# Patient Record
Sex: Female | Born: 1980 | Race: Black or African American | Hispanic: No | Marital: Married | Smoking: Never smoker
Health system: Southern US, Community
[De-identification: ages and names within clinical notes are randomized; demographics above are authoritative.]

## PROBLEM LIST (undated history)

## (undated) DIAGNOSIS — F419 Anxiety disorder, unspecified: Secondary | ICD-10-CM

## (undated) DIAGNOSIS — O039 Complete or unspecified spontaneous abortion without complication: Secondary | ICD-10-CM

## (undated) DIAGNOSIS — A159 Respiratory tuberculosis unspecified: Secondary | ICD-10-CM

## (undated) DIAGNOSIS — K8020 Calculus of gallbladder without cholecystitis without obstruction: Secondary | ICD-10-CM

## (undated) DIAGNOSIS — E7800 Pure hypercholesterolemia, unspecified: Secondary | ICD-10-CM

## (undated) DIAGNOSIS — K760 Fatty (change of) liver, not elsewhere classified: Secondary | ICD-10-CM

## (undated) HISTORY — DX: Gilbert syndrome: E80.4

## (undated) HISTORY — DX: Complete or unspecified spontaneous abortion without complication: O03.9

## (undated) HISTORY — DX: Respiratory tuberculosis unspecified: A15.9

## (undated) HISTORY — DX: Calculus of gallbladder without cholecystitis without obstruction: K80.20

## (undated) HISTORY — PX: COLONOSCOPY: SHX174

## (undated) HISTORY — DX: Fatty (change of) liver, not elsewhere classified: K76.0

## (undated) HISTORY — DX: Anxiety disorder, unspecified: F41.9

## (undated) HISTORY — PX: BUNIONECTOMY: SHX129

## (undated) HISTORY — DX: Pure hypercholesterolemia, unspecified: E78.00

---

## 2013-07-28 DIAGNOSIS — L810 Postinflammatory hyperpigmentation: Secondary | ICD-10-CM | POA: Insufficient documentation

## 2013-07-28 DIAGNOSIS — F411 Generalized anxiety disorder: Secondary | ICD-10-CM | POA: Insufficient documentation

## 2013-07-28 DIAGNOSIS — IMO0002 Reserved for concepts with insufficient information to code with codable children: Secondary | ICD-10-CM | POA: Insufficient documentation

## 2013-07-28 DIAGNOSIS — F424 Excoriation (skin-picking) disorder: Secondary | ICD-10-CM | POA: Insufficient documentation

## 2014-12-07 HISTORY — PX: CHOLECYSTECTOMY: SHX55

## 2016-08-20 DIAGNOSIS — M21619 Bunion of unspecified foot: Secondary | ICD-10-CM | POA: Insufficient documentation

## 2018-02-03 DIAGNOSIS — Z0000 Encounter for general adult medical examination without abnormal findings: Secondary | ICD-10-CM | POA: Diagnosis not present

## 2018-02-03 DIAGNOSIS — Z1329 Encounter for screening for other suspected endocrine disorder: Secondary | ICD-10-CM | POA: Diagnosis not present

## 2018-02-03 DIAGNOSIS — Z1151 Encounter for screening for human papillomavirus (HPV): Secondary | ICD-10-CM | POA: Diagnosis not present

## 2018-02-03 DIAGNOSIS — Z13220 Encounter for screening for lipoid disorders: Secondary | ICD-10-CM | POA: Diagnosis not present

## 2018-02-03 DIAGNOSIS — Z130 Encounter for screening for diseases of the blood and blood-forming organs and certain disorders involving the immune mechanism: Secondary | ICD-10-CM | POA: Diagnosis not present

## 2018-02-03 DIAGNOSIS — Z01419 Encounter for gynecological examination (general) (routine) without abnormal findings: Secondary | ICD-10-CM | POA: Diagnosis not present

## 2018-02-03 DIAGNOSIS — Z131 Encounter for screening for diabetes mellitus: Secondary | ICD-10-CM | POA: Diagnosis not present

## 2018-02-03 DIAGNOSIS — Z6828 Body mass index (BMI) 28.0-28.9, adult: Secondary | ICD-10-CM | POA: Diagnosis not present

## 2018-02-03 DIAGNOSIS — R748 Abnormal levels of other serum enzymes: Secondary | ICD-10-CM | POA: Diagnosis not present

## 2018-02-22 DIAGNOSIS — R635 Abnormal weight gain: Secondary | ICD-10-CM | POA: Diagnosis not present

## 2018-02-24 ENCOUNTER — Encounter: Payer: Self-pay | Admitting: Internal Medicine

## 2018-02-24 DIAGNOSIS — E8881 Metabolic syndrome: Secondary | ICD-10-CM | POA: Diagnosis not present

## 2018-02-24 DIAGNOSIS — E7800 Pure hypercholesterolemia, unspecified: Secondary | ICD-10-CM | POA: Diagnosis not present

## 2018-02-24 DIAGNOSIS — Z1331 Encounter for screening for depression: Secondary | ICD-10-CM | POA: Diagnosis not present

## 2018-02-24 DIAGNOSIS — R945 Abnormal results of liver function studies: Secondary | ICD-10-CM | POA: Diagnosis not present

## 2018-02-24 DIAGNOSIS — Z1339 Encounter for screening examination for other mental health and behavioral disorders: Secondary | ICD-10-CM | POA: Diagnosis not present

## 2018-03-03 DIAGNOSIS — E7800 Pure hypercholesterolemia, unspecified: Secondary | ICD-10-CM | POA: Diagnosis not present

## 2018-03-10 DIAGNOSIS — E7800 Pure hypercholesterolemia, unspecified: Secondary | ICD-10-CM | POA: Diagnosis not present

## 2018-03-10 DIAGNOSIS — E8881 Metabolic syndrome: Secondary | ICD-10-CM | POA: Diagnosis not present

## 2018-03-17 DIAGNOSIS — E7800 Pure hypercholesterolemia, unspecified: Secondary | ICD-10-CM | POA: Diagnosis not present

## 2018-03-18 ENCOUNTER — Ambulatory Visit: Payer: Self-pay | Admitting: Internal Medicine

## 2018-03-24 DIAGNOSIS — E7800 Pure hypercholesterolemia, unspecified: Secondary | ICD-10-CM | POA: Diagnosis not present

## 2018-03-24 DIAGNOSIS — E8881 Metabolic syndrome: Secondary | ICD-10-CM | POA: Diagnosis not present

## 2018-04-01 DIAGNOSIS — E7800 Pure hypercholesterolemia, unspecified: Secondary | ICD-10-CM | POA: Diagnosis not present

## 2018-04-08 DIAGNOSIS — E8881 Metabolic syndrome: Secondary | ICD-10-CM | POA: Diagnosis not present

## 2018-04-15 DIAGNOSIS — E7800 Pure hypercholesterolemia, unspecified: Secondary | ICD-10-CM | POA: Diagnosis not present

## 2018-05-12 ENCOUNTER — Encounter: Payer: Self-pay | Admitting: Internal Medicine

## 2018-05-12 ENCOUNTER — Other Ambulatory Visit (INDEPENDENT_AMBULATORY_CARE_PROVIDER_SITE_OTHER): Payer: BLUE CROSS/BLUE SHIELD

## 2018-05-12 ENCOUNTER — Ambulatory Visit (INDEPENDENT_AMBULATORY_CARE_PROVIDER_SITE_OTHER): Payer: BLUE CROSS/BLUE SHIELD | Admitting: Internal Medicine

## 2018-05-12 VITALS — BP 102/70 | HR 66 | Ht 66.0 in | Wt 166.0 lb

## 2018-05-12 DIAGNOSIS — R945 Abnormal results of liver function studies: Secondary | ICD-10-CM | POA: Diagnosis not present

## 2018-05-12 DIAGNOSIS — K915 Postcholecystectomy syndrome: Secondary | ICD-10-CM | POA: Diagnosis not present

## 2018-05-12 DIAGNOSIS — Z800 Family history of malignant neoplasm of digestive organs: Secondary | ICD-10-CM | POA: Diagnosis not present

## 2018-05-12 DIAGNOSIS — R7989 Other specified abnormal findings of blood chemistry: Secondary | ICD-10-CM | POA: Insufficient documentation

## 2018-05-12 LAB — HEPATIC FUNCTION PANEL
ALBUMIN: 4 g/dL (ref 3.5–5.2)
ALT: 47 U/L — ABNORMAL HIGH (ref 0–35)
AST: 14 U/L (ref 0–37)
Alkaline Phosphatase: 57 U/L (ref 39–117)
BILIRUBIN TOTAL: 1.2 mg/dL (ref 0.2–1.2)
Bilirubin, Direct: 0.2 mg/dL (ref 0.0–0.3)
Total Protein: 7.5 g/dL (ref 6.0–8.3)

## 2018-05-12 NOTE — Patient Instructions (Signed)
  Your provider has requested that you go to the basement level for lab work before leaving today. Press "B" on the elevator. The lab is located at the first door on the left as you exit the elevator.   We will call you with results and plans.    We are putting you in the system for a colonoscopy recall for December 2024.    I appreciate the opportunity to care for you. Stan Headarl Gessner, MD, [facility]

## 2018-05-12 NOTE — Progress Notes (Signed)
Emma Reynolds 37 y.o. [birth year] [id number]  Assessment & Plan:   Encounter Diagnoses  Name Primary?  . Abnormal LFTs Yes  . Post-cholecystectomy syndrome   . Family history of colon cancer in mother - late 26's   . Gilbert's disease      Lab Orders     Hepatitis B surface antigen     Hepatitis B surface antibody     ANA     HEP C AB W/REFL     Hepatic function panel  She had an [place] in 2015 with gallstones but they did not report on liver parenchyma so likely need to repeat vs get old images but since 4 yrs ago anticipate repeat pending serologies, fatty liver seems quite possible but other intrinsic liver disease is also possible.   She seems to tolerate the post-cholecystectomy diarrhea ok so no Tx but discussed pathophysiology and options to Tx  Colonoscopy routine 11/2023 - age 48  She is plugged-in with gynecology and [facility] but I do think having a PCP would be useful and will discuss that with her at a later date.  The elevated unconjugated bilirubin is consistent with Gilbert's disease.  Subjective:   Chief Complaint: abnormal LFT's  HPI The patient is a 37 year old woman self-referred because of abnormal liver chemistries, she actually had abnormal transaminases at gynecology with an ALT of 61 and a normal AST back in March, bilirubin alk phos okay but it really came to light when she went to a weight loss clinic where labs demonstrated abnormal transaminases again ALT 63 normal AST and a slightly elevated bilirubin at 1.3 with top normal 1.2.  Serum ferritin was 66 her kidney function was normal.  She did have triglycerides that were normal a good HDL at 48 and an LDL at 112.  Thyroid testing normal.  Insulin level normal.  Platelets normal as was the rest of CBC, these labs were done in March also but later that month.  She has been successful with weight loss through [facility].  She has been eating differently and also exercising.   Her weight went from 181 in March to 166 pounds now she reports.  She is hoping to have another child, and her gynecologist had suggested she lose some weight before she get pregnant again.  Other issues are that 2 years ago she had a cholecystectomy, and has had some intermittent urgency stools particularly when eating fatty or greasy foods.  She does seem to tolerate this overall, she is not interested in having therapy for this right now.  She is concerned about her liver chemistries.  She does not drink much, perhaps up to 1 a day at times.  There is no family history of liver disease.  Her mother did have colon cancer she says twice, diagnosed in late 59s, perhaps she had a recurrence I think.  Patient has not had a colonoscopy yet.  2 siblings she is not aware if they have had colonoscopy.  GI review of systems are otherwise negative at this time.  Also note that she does not have any history of IV drug abuse needlesticks etc.  In 2016, bilirubin was in the mid twos 1.9 and a conjugated bilirubin was drawn and was low 0.2 and 0.1 so this is all indirect.  Her transaminases were significantly higher prior to her cholecystectomy with transaminases 390 and 244 and 202 82 but they normalized after the cholecystectomy.  Except for  the bilirubin. Allergies no known allergies Current Meds  Medication Sig  . BIOTIN PO Take 1 tablet by mouth daily.  Marland Kitchen etonogestrel-ethinyl estradiol (NUVARING) 0.12-0.015 MG/24HR vaginal ring Place 1 each vaginally every 28 (twenty-eight) days. Insert vaginally and leave in place for 3 consecutive weeks, then remove for 1 week.  . MULTIPLE VITAMIN PO Take by mouth. [place] energy and metabolism , one daily   Past Medical History:  Diagnosis Date  . Anxiety   . Elevated cholesterol   . Gallstones    Past Surgical History:  Procedure Laterality Date  . BUNIONECTOMY    . 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   2 sons one born 2013 1 born 2015.   Never smoker never user of drugs occasional alcohol not more than 1 and a day and not more than 1 caffeinated beverage a day   family history includes Colon cancer in her mother; Heart disease in her mother; Kidney cancer in her mother.   Review of Systems As per HPI.  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  Apparently her mother has the same problem.  All other review of systems appear negative at this time.  Objective:   Physical Exam _0  102/70   Pulse 66   Ht _1  (1.676 m)   Wt 166 lb (75.3 kg)   BMI 26.79 kg/m @  General:  Well-developed, well-nourished and in no acute distress Eyes:  anicteric.  Neck:   supple w/o thyromegaly or mass.  Lungs: Clear to auscultation bilaterally. Heart:  S1S2, no rubs, murmurs, gallops. Abdomen:  soft, non-tender, no hepatosplenomegaly, hernia, or mass and BS+.  Lymph:  no cervical or supraclavicular adenopathy. Extremities:   no edema, cyanosis or clubbing Skin   multiple hyperpigmented macules Neuro:  A&O x 3.  Psych:  appropriate mood and  Affect.   Data Reviewed:  See HPI.  I did review [facility] records from 2016 through care everywhere including the ultrasound and the intraoperative cholangiogram reports.  Labs also.  See HPI.

## 2018-05-15 ENCOUNTER — Encounter: Payer: Self-pay | Admitting: Internal Medicine

## 2018-05-16 ENCOUNTER — Other Ambulatory Visit: Payer: Self-pay

## 2018-05-16 DIAGNOSIS — R7989 Other specified abnormal findings of blood chemistry: Secondary | ICD-10-CM

## 2018-05-16 DIAGNOSIS — R945 Abnormal results of liver function studies: Secondary | ICD-10-CM

## 2018-05-16 LAB — HEP C AB W/REFL
HEPATITIS C ANTIBODY REFILL$(REFL): NONREACTIVE
SIGNAL TO CUT-OFF: 0.02 (ref <1.00)

## 2018-05-16 LAB — ANA: ANA: NEGATIVE

## 2018-05-16 LAB — REFLEX TIQ

## 2018-05-16 LAB — HEPATITIS B SURFACE ANTIBODY,QUALITATIVE: HEP B S AB: REACTIVE — AB

## 2018-05-16 LAB — HEPATITIS B SURFACE ANTIGEN: Hepatitis B Surface Ag: NONREACTIVE

## 2018-05-16 NOTE — Progress Notes (Signed)
Labs ok No HCV Immune to Hep B Transaminase better  I recommend she get a RUQ US re: abnormal transaminases

## 2018-05-16 NOTE — Progress Notes (Signed)
ruq us  

## 2018-05-25 ENCOUNTER — Ambulatory Visit (HOSPITAL_COMMUNITY): Payer: Self-pay

## 2018-05-27 ENCOUNTER — Ambulatory Visit (HOSPITAL_COMMUNITY)
Admission: RE | Admit: 2018-05-27 | Discharge: 2018-05-27 | Disposition: A | Discharge status: Home / Self Care | Payer: BLUE CROSS/BLUE SHIELD | Source: Ambulatory Visit | Attending: Internal Medicine | Admitting: Internal Medicine

## 2018-05-27 DIAGNOSIS — R945 Abnormal results of liver function studies: Secondary | ICD-10-CM | POA: Insufficient documentation

## 2018-05-27 DIAGNOSIS — R748 Abnormal levels of other serum enzymes: Secondary | ICD-10-CM | POA: Diagnosis not present

## 2018-05-27 DIAGNOSIS — Z9049 Acquired absence of other specified parts of digestive tract: Secondary | ICD-10-CM | POA: Diagnosis not present

## 2018-05-27 DIAGNOSIS — R7989 Other specified abnormal findings of blood chemistry: Secondary | ICD-10-CM

## 2018-05-29 ENCOUNTER — Encounter: Payer: Self-pay | Admitting: Internal Medicine

## 2018-05-29 HISTORY — DX: Gilbert syndrome: E80.4

## 2018-05-29 NOTE — Progress Notes (Signed)
Let her know liver looks fine Since ALT is lower and other testing negative I suggest she do hepatic function panel again in 2 months dx abnl transaminase  If persistently elevated then we will consider additional blood tests  At this point seems unlikely that any sig problem with liver exists  Keep up the good work with weight loss  Let me know if ?

## 2018-05-30 ENCOUNTER — Other Ambulatory Visit: Payer: Self-pay

## 2018-05-30 DIAGNOSIS — R7989 Other specified abnormal findings of blood chemistry: Secondary | ICD-10-CM

## 2018-05-30 DIAGNOSIS — R945 Abnormal results of liver function studies: Secondary | ICD-10-CM

## 2018-11-10 DIAGNOSIS — Z23 Encounter for immunization: Secondary | ICD-10-CM | POA: Diagnosis not present

## 2018-11-18 DIAGNOSIS — L810 Postinflammatory hyperpigmentation: Secondary | ICD-10-CM | POA: Diagnosis not present

## 2018-11-18 DIAGNOSIS — L281 Prurigo nodularis: Secondary | ICD-10-CM | POA: Diagnosis not present

## 2019-02-03 DIAGNOSIS — Z6828 Body mass index (BMI) 28.0-28.9, adult: Secondary | ICD-10-CM | POA: Diagnosis not present

## 2019-02-03 DIAGNOSIS — Z1151 Encounter for screening for human papillomavirus (HPV): Secondary | ICD-10-CM | POA: Diagnosis not present

## 2019-02-03 DIAGNOSIS — Z113 Encounter for screening for infections with a predominantly sexual mode of transmission: Secondary | ICD-10-CM | POA: Diagnosis not present

## 2019-02-03 DIAGNOSIS — Z01419 Encounter for gynecological examination (general) (routine) without abnormal findings: Secondary | ICD-10-CM | POA: Diagnosis not present

## 2019-02-03 DIAGNOSIS — N898 Other specified noninflammatory disorders of vagina: Secondary | ICD-10-CM | POA: Diagnosis not present

## 2019-08-25 IMAGING — US US ABDOMEN LIMITED
1 series · 14 of 25 positions shown · non-contrast
Comparison: None.

CLINICAL DATA: Elevated liver enzymes

EXAM:
ULTRASOUND ABDOMEN LIMITED RIGHT UPPER QUADRANT

[Series 1: us abdomen limited · 14 of 54 slices shown]
[im 1/54]
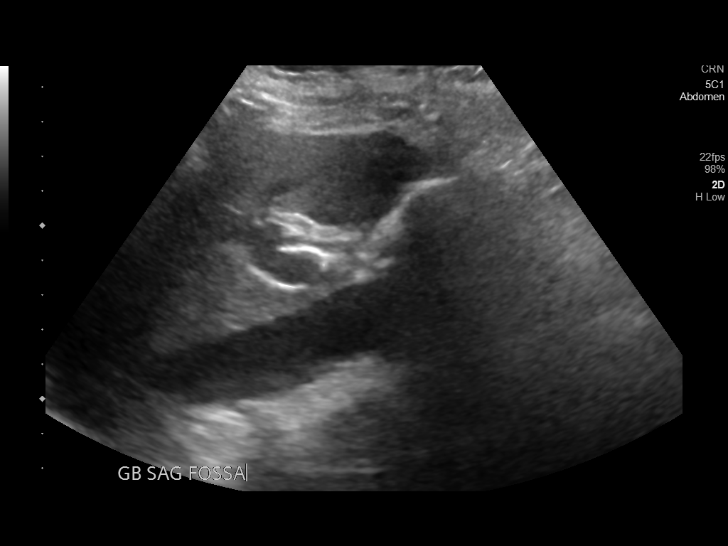
[im 5/54]
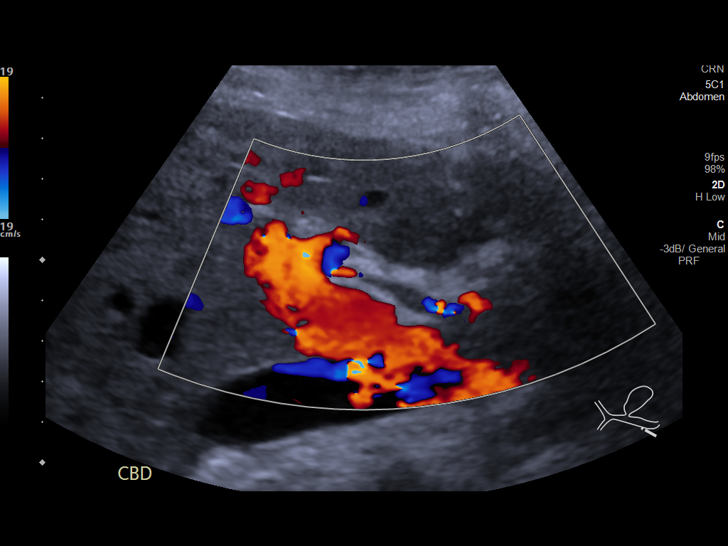
[im 9/54]
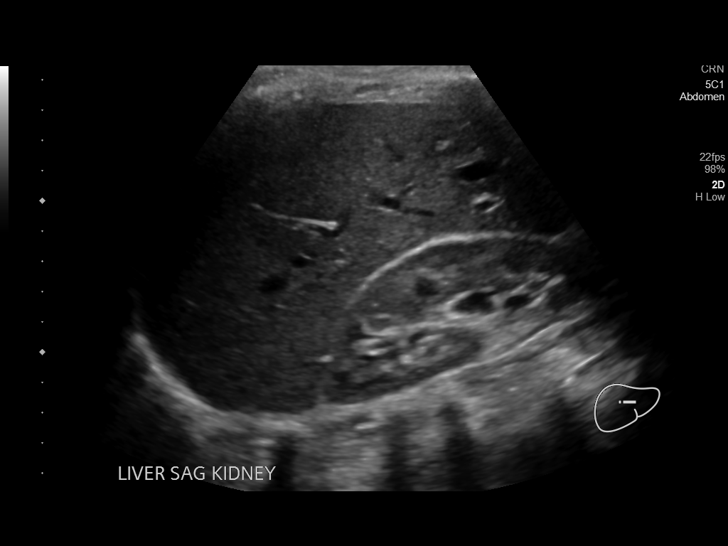
[im 14/54]
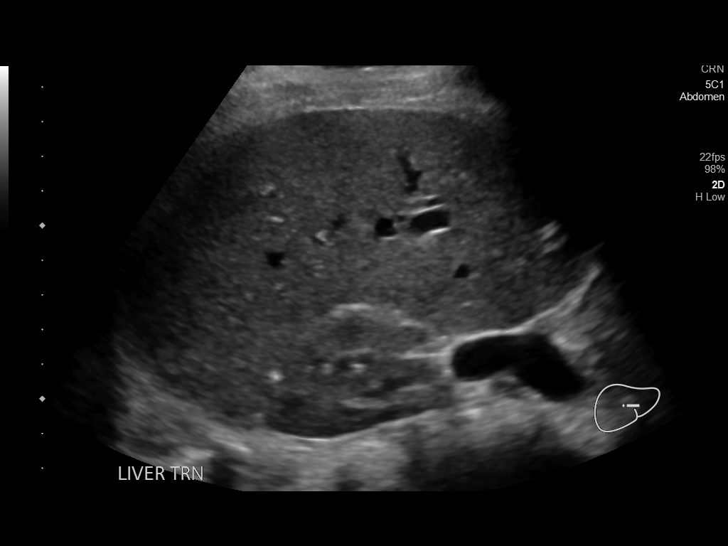
[im 18/54]
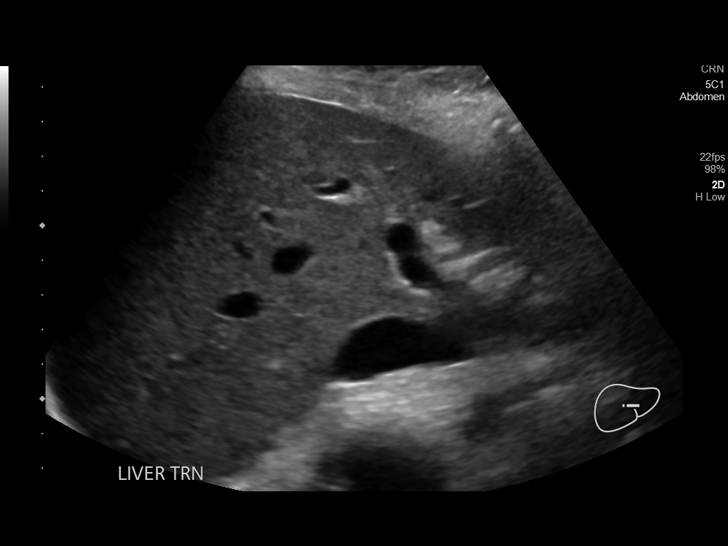
[im 20/54]
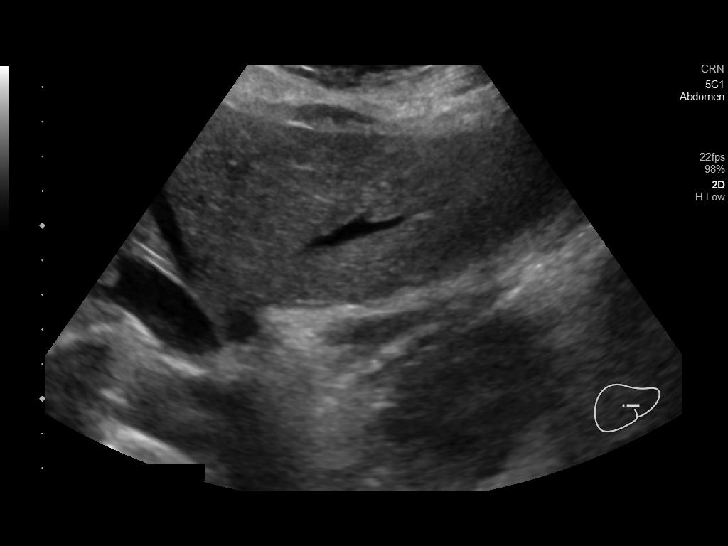
[im 25/54]
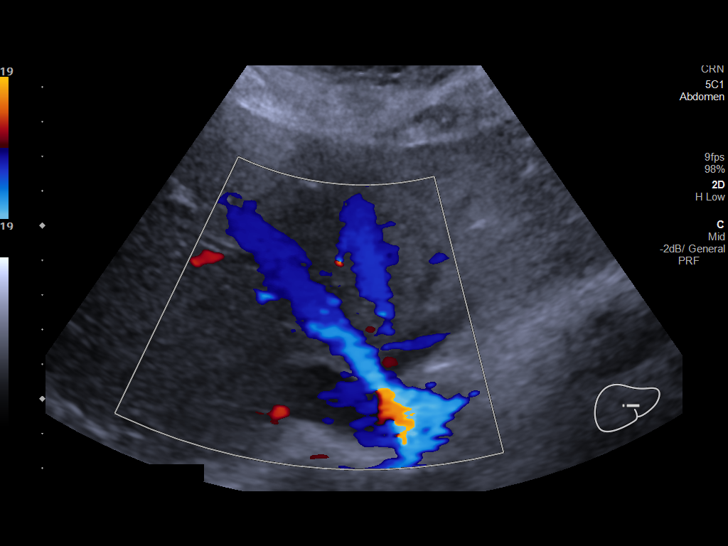
[im 29/54]
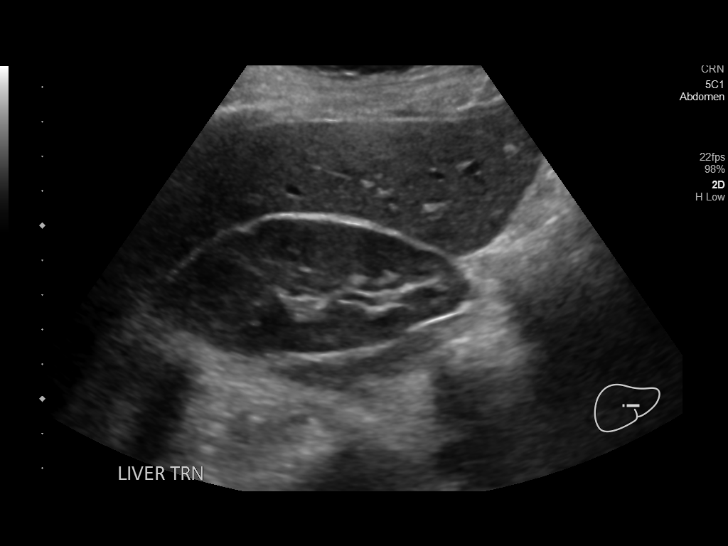
[im 34/54]
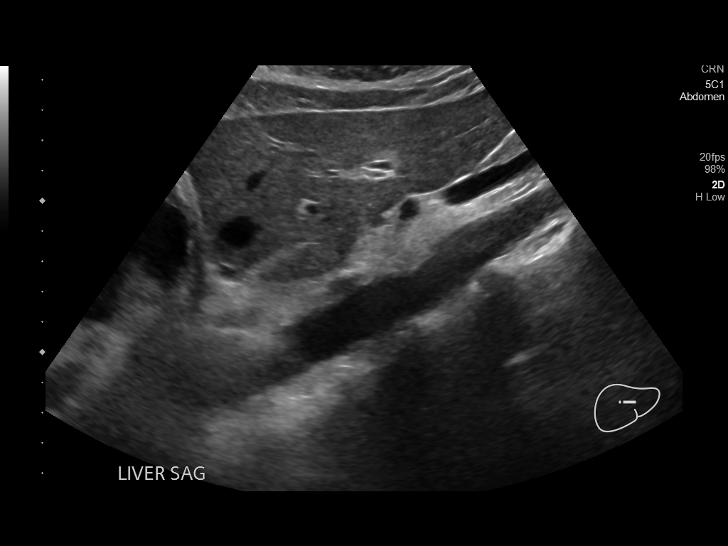
[im 36/54]
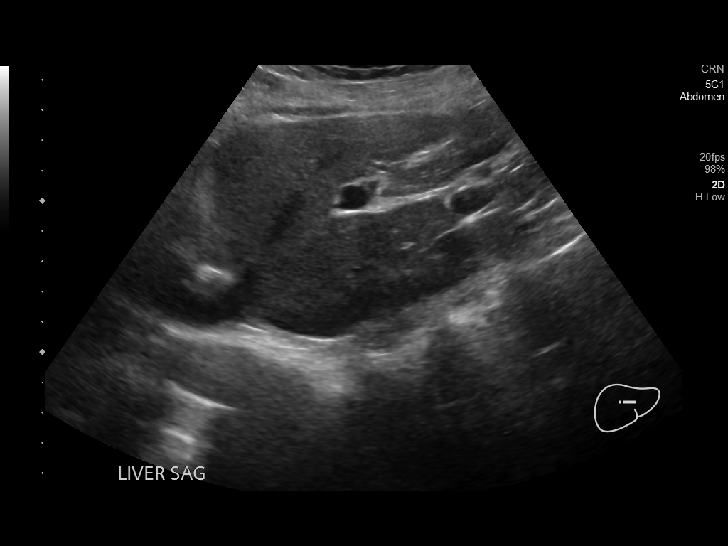
[im 40/54]
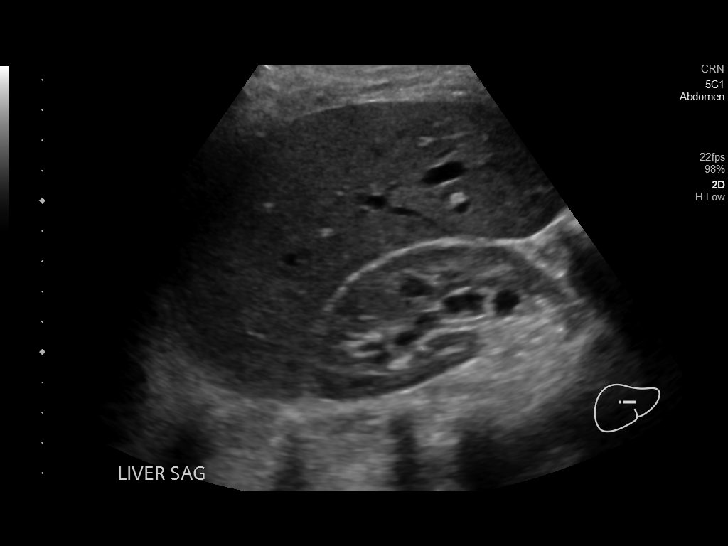
[im 45/54]
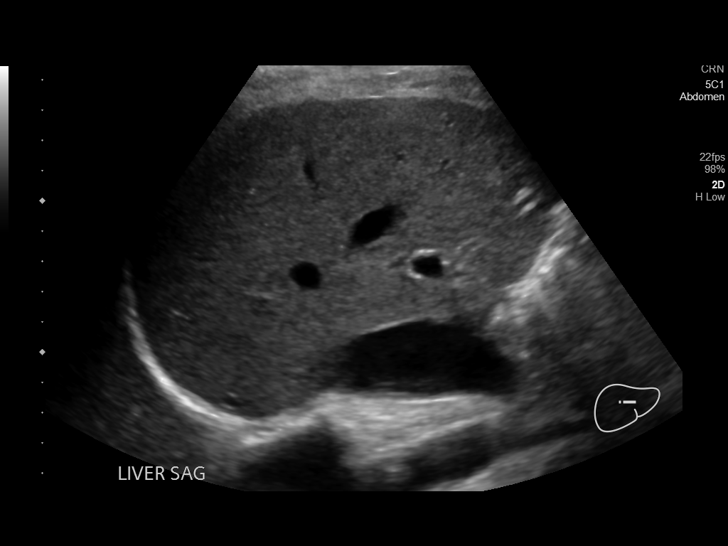
[im 49/54]
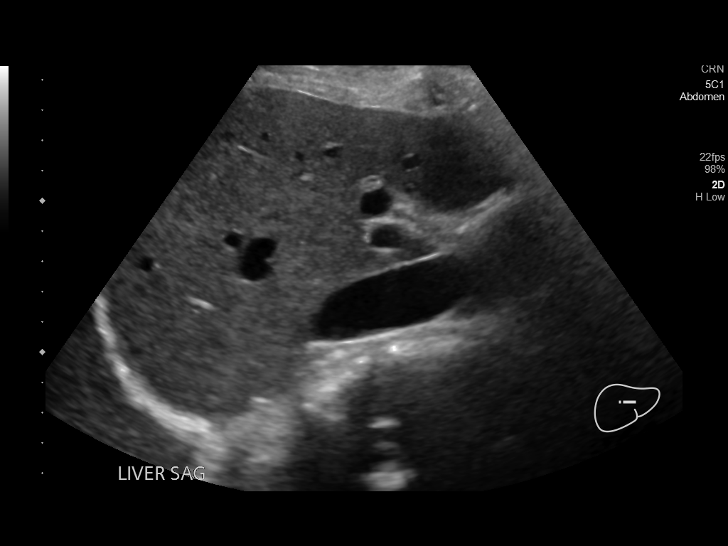
[im 54/54]
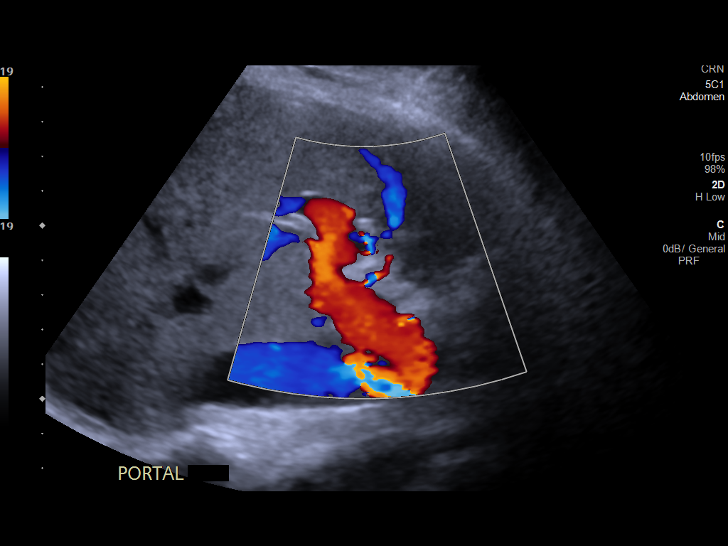

[14 of 25 positions shown; findings below may reference images not displayed]

FINDINGS: Gallbladder:

Surgically absent.

Common bile duct:

Diameter: 3 mm. No intrahepatic or extrahepatic biliary duct
dilatation.

Liver:

No focal lesion identified. Within normal limits in parenchymal
echogenicity. Portal vein is patent on color Doppler imaging with
normal direction of blood flow towards the liver.
IMPRESSION: Gallbladder absent.  Study otherwise unremarkable.

## 2020-02-26 ENCOUNTER — Ambulatory Visit (INDEPENDENT_AMBULATORY_CARE_PROVIDER_SITE_OTHER): Payer: 59 | Admitting: Internal Medicine

## 2020-02-26 ENCOUNTER — Encounter: Payer: Self-pay | Admitting: Internal Medicine

## 2020-02-26 ENCOUNTER — Other Ambulatory Visit: Payer: Self-pay

## 2020-02-26 DIAGNOSIS — F419 Anxiety disorder, unspecified: Secondary | ICD-10-CM

## 2020-02-26 MED ORDER — FLUOXETINE HCL 10 MG PO CAPS: 10.0000 mg | ORAL_CAPSULE | Freq: Every day | ORAL | 2 refills | Status: DC

## 2020-02-26 NOTE — Progress Notes (Signed)
HPI  Pt presents to the clinic today to establish care. She has not a PCP in many years.  Anxiety: She has concerns about skin picking disorder and OCD. She has seen dermatology in the past and they felt like this was due to uncontrolled anxiety. She had never taken anything for anxiety in the past. She has not seen a therapist. She denies depression, SI/HI.  Rosanna Randy Syndrome: Asymptomatic. S/P cholecystectomy. She follows with Dr. Carlean Purl.  Flu: 09/2019 Tetanus: 06/2012 Pap Smear: 01/2019 Dentist: biannually  Past Medical History:  Diagnosis Date  . Anxiety   . Elevated cholesterol   . Fatty liver   . Gallstones   . Gilbert's syndrome 05/29/2018    Current Outpatient Medications  Medication Sig Dispense Refill  . BIOTIN PO Take 1 tablet by mouth daily.    . Cholecalciferol (DIALYVITE VITAMIN D 5000 PO) Take by mouth.    . etonogestrel-ethinyl estradiol (NUVARING) 0.12-0.015 MG/24HR vaginal ring Place 1 each vaginally every 28 (twenty-eight) days. Insert vaginally and leave in place for 3 consecutive weeks, then remove for 1 week.    . MULTIPLE VITAMIN PO Take by mouth. [place] energy and metabolism , one daily     No current facility-administered medications for this visit.    No Known Allergies  Family History  Problem Relation Age of Onset  . Colon cancer Mother        Late 24s  . Heart disease Mother   . Kidney failure Mother   . Hypertension Father   . COPD Maternal Grandmother   . Arthritis Maternal Grandmother   . Stroke Maternal Grandfather   . Heart disease Maternal Grandfather   . Prostate cancer Maternal Grandfather   . Heart attack Paternal Grandmother   . Dementia Paternal Grandfather     Social History   Socioeconomic History  . Marital status: Married    Spouse name: Not on file  . Number of children: 2  . Years of education: Not on file  . Highest education level: Not on file  Occupational History  . Occupation: Tax inspector  Tobacco Use  .  Smoking status: Never Smoker  . Smokeless tobacco: Never Used  Substance and Sexual Activity  . Alcohol use: Yes    Comment: occasional  . Drug use: Never  . Sexual activity: Yes    Partners: Male    Birth control/protection: Inserts    Comment: Nuva Ring  Other Topics Concern  . 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   2 sons one born 2013 1 born 2015.   Never smoker never user of drugs occasional alcohol not more than 1 and a day and not more than 1 caffeinated beverage a day   Social Determinants of Health   Financial Resource Strain:   . Difficulty of Paying Living Expenses:   Food Insecurity:   . Worried About Charity fundraiser in the Last Year:   . Arboriculturist in the Last Year:   Transportation Needs:   . Film/video editor (Medical):   Marland Kitchen Lack of Transportation (Non-Medical):   Physical Activity:   . Days of Exercise per Week:   . Minutes of Exercise per Session:   Stress:   . Feeling of Stress :   Social Connections:   . Frequency of Communication with Friends and Family:   . Frequency of  Social Gatherings with Friends and Family:   . Attends Religious Services:   . Active Member of Clubs or Organizations:   . Attends Banker Meetings:   Marland Kitchen Marital Status:   Intimate Partner Violence:   . Fear of Current or Ex-Partner:   . Emotionally Abused:   Marland Kitchen Physically Abused:   . Sexually Abused:     ROS:  Constitutional: Denies fever, malaise, fatigue, headache or abrupt weight changes.  HEENT: Denies eye pain, eye redness, ear pain, ringing in the ears, wax buildup, runny nose, nasal congestion, bloody nose, or sore throat. Respiratory: Denies difficulty breathing, shortness of breath, cough or sputum production.   Cardiovascular: Denies chest pain, chest tightness, palpitations or swelling in the hands or feet.   Gastrointestinal: Denies abdominal pain, bloating, constipation, diarrhea or blood in the stool.  GU: Denies frequency, urgency, pain with urination, blood in urine, odor or discharge. Musculoskeletal: Denies decrease in range of motion, difficulty with gait, muscle pain or joint pain and swelling.  Skin: Pt reports scarring of skin secondary to picking disorder. Denies redness, rashes, lesions or ulcercations.  Neurological: Denies dizziness, difficulty with memory, difficulty with speech or problems with balance and coordination.  Psych: Pt reports anxiety. Denies depression, SI/HI.  No other specific complaints in a complete review of systems (except as listed in HPI above).  PE:  BP 116/78   Pulse 71   Temp 98.3 F (36.8 C) (Temporal)   Ht 5\' 6"  (1.676 m)   Wt 199 lb (90.3 kg)   LMP 02/19/2020 Comment: Nuvaring  SpO2 98%   BMI 32.12 kg/m  Wt Readings from Last 3 Encounters:  02/26/20 199 lb (90.3 kg)  05/12/18 166 lb (75.3 kg)    General: Appears her stated age, obese, in NAD. Skin: Multiple hyperpigmented areas noted on bilateral arms and upper chest. HEENT: Head: normal shape and size; Eyes: sclera white, no icterus, conjunctiva pink, PERRLA and EOMs intact;  Cardiovascular: Normal rate and rhythm.  Pulmonary/Chest: Normal effort and positive vesicular breath sounds.  Abdomen: Soft and nontender. Normal bowel sounds, no bruits noted. No distention or masses noted. Liver, spleen and kidneys non palpable. Neurological: Alert and oriented.  Psychiatric: Mood and affect normal. Behavior is normal. Judgment and thought content normal.     Assessment and Plan:

## 2020-02-26 NOTE — Patient Instructions (Signed)

## 2020-02-26 NOTE — Assessment & Plan Note (Signed)
Will trial Fluoxetine 10 mg daily Discussed common side effects, possibility of SI Advised her to stop medication immediately if she develops SI  Support offered today  Update me in 1 month via mychart

## 2020-02-26 NOTE — Assessment & Plan Note (Signed)
Will check CMET at annual exam

## 2020-03-11 ENCOUNTER — Other Ambulatory Visit: Payer: Self-pay | Admitting: Internal Medicine

## 2020-04-04 ENCOUNTER — Encounter: Payer: Self-pay | Admitting: Internal Medicine

## 2020-04-04 ENCOUNTER — Other Ambulatory Visit: Payer: Self-pay

## 2020-04-04 ENCOUNTER — Ambulatory Visit (INDEPENDENT_AMBULATORY_CARE_PROVIDER_SITE_OTHER): Payer: BC Managed Care – PPO | Admitting: Internal Medicine

## 2020-04-04 VITALS — BP 116/74 | HR 74 | Temp 98.3°F | Ht 66.0 in | Wt 198.0 lb

## 2020-04-04 DIAGNOSIS — Z0000 Encounter for general adult medical examination without abnormal findings: Secondary | ICD-10-CM

## 2020-04-04 NOTE — Progress Notes (Signed)
Subjective:    Patient ID: Emma Reynolds, female    DOB: 02-Apr-1981, 39 y.o.   MRN: [id number]  HPI  Pt presents to the clinic today for her annual exam.  She was started on Fluoxetine for anxiety and skin picking disorder. She never started this medication and is unsure if she will start this.  Flu: 09/2019 Tetanus: 06/2012 Covid: 02/2020, 03/2020 Pap Smear: 01/2019 Dentist: biannually  Diet: She does eat meat. She consumes some fruits and veggies daily. She tries to avoid fried foods. She drinks mostly water, some soda. Exercise: Working out tapes, 30 minutes at least 3 days per week.  Review of Systems      Past Medical History:  Diagnosis Date  . Anxiety   . Fatty liver   . Gallstones   . Gilbert's syndrome 05/29/2018    Current Outpatient Medications  Medication Sig Dispense Refill  . BIOTIN PO Take 1 tablet by mouth daily.    . Cholecalciferol (DIALYVITE VITAMIN D 5000 PO) Take by mouth.    . etonogestrel-ethinyl estradiol (NUVARING) 0.12-0.015 MG/24HR vaginal ring Place 1 each vaginally every 28 (twenty-eight) days. Insert vaginally and leave in place for 3 consecutive weeks, then remove for 1 week.    Marland Kitchen FLUoxetine (PROZAC) 10 MG capsule Take 1 capsule (10 mg total) by mouth daily. 30 capsule 2  . MULTIPLE VITAMIN PO Take by mouth. [place] energy and metabolism , one daily     No current facility-administered medications for this visit.    No Known Allergies  Family History  Problem Relation Age of Onset  . Colon cancer Mother        Late 28s  . Heart disease Mother   . Kidney failure Mother   . Hypertension Father   . COPD Maternal Grandmother   . Arthritis Maternal Grandmother   . Stroke Maternal Grandfather   . Heart disease Maternal Grandfather   . Prostate cancer Maternal Grandfather   . Heart attack Paternal Grandmother   . Dementia Paternal Grandfather     Social History   Socioeconomic History  . Marital status: Married    Spouse name: Not on file    . Number of children: 2  . Years of education: Not on file  . Highest education level: Not on file  Occupational History  . Occupation: Tax inspector  Tobacco Use  . Smoking status: Never Smoker  . Smokeless tobacco: Never Used  Substance and Sexual Activity  . Alcohol use: Yes    Comment: occasional  . Drug use: Never  . Sexual activity: Yes    Partners: Male    Birth control/protection: Inserts    Comment: Nuva Ring  Other Topics Concern  . 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   2 sons one born 2013 1 born 2015.   Never smoker never user of drugs occasional alcohol not more than 1 and a day and not more than 1 caffeinated beverage a day   Social Determinants of Health   Financial Resource Strain:   . Difficulty of Paying Living Expenses:   Food Insecurity:   . Worried About Charity fundraiser in the Last Year:   . Arboriculturist in the Last Year:   Transportation Needs:   . Film/video editor (Medical):   Marland Kitchen Lack of Transportation (Non-Medical):   Physical Activity:   .  Days of Exercise per Week:   . Minutes of Exercise per Session:   Stress:   . Feeling of Stress :   Social Connections:   . Frequency of Communication with Friends and Family:   . Frequency of Social Gatherings with Friends and Family:   . Attends Religious Services:   . Active Member of Clubs or Organizations:   . Attends Banker Meetings:   Marland Kitchen Marital Status:   Intimate Partner Violence:   . Fear of Current or Ex-Partner:   . Emotionally Abused:   Marland Kitchen Physically Abused:   . Sexually Abused:      Constitutional: Denies fever, malaise, fatigue, headache or abrupt weight changes.  HEENT: Denies eye pain, eye redness, ear pain, ringing in the ears, wax buildup, runny nose, nasal congestion, bloody nose, or sore throat. Respiratory: Denies  difficulty breathing, shortness of breath, cough or sputum production.   Cardiovascular: Denies chest pain, chest tightness, palpitations or swelling in the hands or feet.  Gastrointestinal: Denies abdominal pain, bloating, constipation, diarrhea or blood in the stool.  GU: Denies urgency, frequency, pain with urination, burning sensation, blood in urine, odor or discharge. Musculoskeletal: Denies decrease in range of motion, difficulty with gait, muscle pain or joint pain and swelling.  Skin: Pt reports hyperpigmentation of skin. Denies redness, rashes, lesions or ulcercations.  Neurological: Denies dizziness, difficulty with memory, difficulty with speech or problems with balance and coordination.  Psych: Pt has a history of anxiety. Denies depression, SI/HI.  No other specific complaints in a complete review of systems (except as listed in HPI above).  Objective:   Physical Exam   BP 116/74   Pulse 74   Temp 98.3 F (36.8 C) (Temporal)   Ht 5\' 6"  (1.676 m)   Wt 198 lb (89.8 kg)   BMI 31.96 kg/m   Wt Readings from Last 3 Encounters:  02/26/20 199 lb (90.3 kg)  05/12/18 166 lb (75.3 kg)    General: Appears her stated age, obese, in NAD. Skin: Warm, dry and intact. Hyperpigmented skin noted of BUE. HEENT: Head: normal shape and size; Eyes: sclera white, no icterus, conjunctiva pink, PERRLA and EOMs intact; Ears: Tm's gray and intact, normal light reflex; Nose: mucosa pink and moist, septum midline;  Neck:  Neck supple, trachea midline. No masses, lumps or thyromegaly present.  Cardiovascular: Normal rate and rhythm. S1,S2 noted.  No murmur, rubs or gallops noted. No JVD or BLE edema.  Pulmonary/Chest: Normal effort and positive vesicular breath sounds. No respiratory distress. No wheezes, rales or ronchi noted.  Abdomen: Soft and nontender. Normal bowel sounds. No distention or masses noted. Liver, spleen and kidneys non palpable. Musculoskeletal: Strength 5/5 BUE/BLE. No  difficulty with gait.  Neurological: Alert and oriented. Cranial nerves II-XII grossly intact. Coordination normal.  Psychiatric: Mood and affect normal. Behavior is normal. Judgment and thought content normal.         Assessment & Plan:  Preventative Health Maintenance:  Encouraged her to get a flu shot in the fall Tetanus UTD Covid UTD Pap smear UTD Encouraged her to consume a balanced diet and exercise regimen Advised her to see a dentist annually Labs from [facility] MD reviewed  RTC in 1 year, sooner if needed  [facility], NP This visit occurred during the SARS-CoV-2 public health emergency.  Safety protocols were in place, including screening questions prior to the visit, additional usage of staff PPE, and extensive cleaning of exam room while observing appropriate contact time as  indicated for disinfecting solutions.

## 2020-04-04 NOTE — Patient Instructions (Signed)
Health Maintenance, Female Adopting a healthy lifestyle and getting preventive care are important in promoting health and wellness. Ask your health care provider about:  The right schedule for you to have regular tests and exams.  Things you can do on your own to prevent diseases and keep yourself healthy. What should I know about diet, weight, and exercise? Eat a healthy diet   Eat a diet that includes plenty of vegetables, fruits, low-fat dairy products, and lean protein.  Do not eat a lot of foods that are high in solid fats, added sugars, or sodium. Maintain a healthy weight Body mass index (BMI) is used to identify weight problems. It estimates body fat based on height and weight. Your health care provider can help determine your BMI and help you achieve or maintain a healthy weight. Get regular exercise Get regular exercise. This is one of the most important things you can do for your health. Most adults should:  Exercise for at least 150 minutes each week. The exercise should increase your heart rate and make you sweat (moderate-intensity exercise).  Do strengthening exercises at least twice a week. This is in addition to the moderate-intensity exercise.  Spend less time sitting. Even light physical activity can be beneficial. Watch cholesterol and blood lipids Have your blood tested for lipids and cholesterol at 37 years of age, then have this test every 5 years. Have your cholesterol levels checked more often if:  Your lipid or cholesterol levels are high.  You are older than 37 years of age.  You are at high risk for heart disease. What should I know about cancer screening? Depending on your health history and family history, you may need to have cancer screening at various ages. This may include screening for:  Breast cancer.  Cervical cancer.  Colorectal cancer.  Skin cancer.  Lung cancer. What should I know about heart disease, diabetes, and high blood  pressure? Blood pressure and heart disease  High blood pressure causes heart disease and increases the risk of stroke. This is more likely to develop in people who have high blood pressure readings, are of African descent, or are overweight.  Have your blood pressure checked: ? Every 3-5 years if you are 18-39 years of age. ? Every year if you are 40 years old or older. Diabetes Have regular diabetes screenings. This checks your fasting blood sugar level. Have the screening done:  Once every three years after age 40 if you are at a normal weight and have a low risk for diabetes.  More often and at a younger age if you are overweight or have a high risk for diabetes. What should I know about preventing infection? Hepatitis B If you have a higher risk for hepatitis B, you should be screened for this virus. Talk with your health care provider to find out if you are at risk for hepatitis B infection. Hepatitis C Testing is recommended for:  Everyone born from 1945 through 1965.  Anyone with known risk factors for hepatitis C. Sexually transmitted infections (STIs)  Get screened for STIs, including gonorrhea and chlamydia, if: ? You are sexually active and are younger than 37 years of age. ? You are older than 37 years of age and your health care provider tells you that you are at risk for this type of infection. ? Your sexual activity has changed since you were last screened, and you are at increased risk for chlamydia or gonorrhea. Ask your health care provider if   you are at risk.  Ask your health care provider about whether you are at high risk for HIV. Your health care provider may recommend a prescription medicine to help prevent HIV infection. If you choose to take medicine to prevent HIV, you should first get tested for HIV. You should then be tested every 3 months for as long as you are taking the medicine. Pregnancy  If you are about to stop having your period (premenopausal) and  you may become pregnant, seek counseling before you get pregnant.  Take 400 to 800 micrograms (mcg) of folic acid every day if you become pregnant.  Ask for birth control (contraception) if you want to prevent pregnancy. Osteoporosis and menopause Osteoporosis is a disease in which the bones lose minerals and strength with aging. This can result in bone fractures. If you are 65 years old or older, or if you are at risk for osteoporosis and fractures, ask your health care provider if you should:  Be screened for bone loss.  Take a calcium or vitamin D supplement to lower your risk of fractures.  Be given hormone replacement therapy (HRT) to treat symptoms of menopause. Follow these instructions at home: Lifestyle  Do not use any products that contain nicotine or tobacco, such as cigarettes, e-cigarettes, and chewing tobacco. If you need help quitting, ask your health care provider.  Do not use street drugs.  Do not share needles.  Ask your health care provider for help if you need support or information about quitting drugs. Alcohol use  Do not drink alcohol if: ? Your health care provider tells you not to drink. ? You are pregnant, may be pregnant, or are planning to become pregnant.  If you drink alcohol: ? Limit how much you use to 0-1 drink a day. ? Limit intake if you are breastfeeding.  Be aware of how much alcohol is in your drink. In the [place] one drink equals one 12 oz bottle of beer (355 mL), one 5 oz glass of wine (148 mL), or one 1 oz glass of hard liquor (44 mL). General instructions  Schedule regular health, dental, and eye exams.  Stay current with your vaccines.  Tell your health care provider if: ? You often feel depressed. ? You have ever been abused or do not feel safe at home. Summary  Adopting a healthy lifestyle and getting preventive care are important in promoting health and wellness.  Follow your health care provider's instructions about healthy  diet, exercising, and getting tested or screened for diseases.  Follow your health care provider's instructions on monitoring your cholesterol and blood pressure. This information is not intended to replace advice given to you by your health care provider. Make sure you discuss any questions you have with your health care provider. Document Revised: 11/16/2018 Document Reviewed: 11/16/2018 Elsevier Patient Education  2020 [employer]  

## 2020-10-09 ENCOUNTER — Other Ambulatory Visit: Payer: Self-pay

## 2020-10-09 ENCOUNTER — Ambulatory Visit: Payer: BLUE CROSS/BLUE SHIELD

## 2020-10-09 ENCOUNTER — Encounter: Payer: BLUE CROSS/BLUE SHIELD | Admitting: Podiatry

## 2020-10-10 ENCOUNTER — Ambulatory Visit: Payer: Self-pay | Admitting: Podiatry

## 2020-10-23 ENCOUNTER — Ambulatory Visit: Payer: BLUE CROSS/BLUE SHIELD

## 2020-10-23 ENCOUNTER — Encounter (INDEPENDENT_AMBULATORY_CARE_PROVIDER_SITE_OTHER): Payer: BLUE CROSS/BLUE SHIELD | Admitting: Podiatry

## 2020-10-23 NOTE — Progress Notes (Signed)
This encounter was created in error - please disregard.

## 2020-12-17 DIAGNOSIS — Z20822 Contact with and (suspected) exposure to covid-19: Secondary | ICD-10-CM | POA: Diagnosis not present

## 2020-12-19 ENCOUNTER — Ambulatory Visit
Admission: EM | Admit: 2020-12-19 | Discharge: 2020-12-19 | Disposition: A | Discharge status: Home / Self Care | Payer: No Typology Code available for payment source | Attending: Emergency Medicine | Admitting: Emergency Medicine

## 2020-12-19 ENCOUNTER — Other Ambulatory Visit: Payer: Self-pay

## 2020-12-19 ENCOUNTER — Other Ambulatory Visit (HOSPITAL_COMMUNITY): Payer: Self-pay | Admitting: Emergency Medicine

## 2020-12-19 DIAGNOSIS — N390 Urinary tract infection, site not specified: Secondary | ICD-10-CM | POA: Insufficient documentation

## 2020-12-19 LAB — POCT URINALYSIS DIP (MANUAL ENTRY)
Glucose, UA: NEGATIVE mg/dL
Nitrite, UA: POSITIVE — AB
Protein Ur, POC: >=300 mg/dL — AB
Spec Grav, UA: >=1.03 — AB (ref 1.010–1.025)
Urobilinogen, UA: 1 E.U./dL
pH, UA: 6 (ref 5.0–8.0)

## 2020-12-19 LAB — POCT URINE PREGNANCY: Preg Test, Ur: NEGATIVE

## 2020-12-19 MED ORDER — NITROFURANTOIN MONOHYD MACRO 100 MG PO CAPS
100.0000 mg | ORAL_CAPSULE | Freq: Two times a day (BID) | ORAL | 0 refills | Status: AC
Start: 2020-12-19 — End: 2020-12-24

## 2020-12-19 NOTE — ED Triage Notes (Signed)
Patient complains of urinary frequency and burning since yesterday. Pt also states she began having right flank pain today and it has been worsening throughout the day. Pt is aox4 and ambulatory.

## 2020-12-19 NOTE — ED Provider Notes (Signed)
[facility]    CSN: [id number] Arrival date & time: 12/19/20  1812      History   Chief Complaint Chief Complaint  Patient presents with   Flank Pain    today   Urinary Frequency    Since yesterday   Dysuria    Since yesterday    HPI Emma Reynolds is a 40 y.o. 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  Reports that she has had flank pain and dysuria.  Reports symptoms began over the past 1 to 2 days.  Has had some slight irritation near her vaginal opening with urination, but denies any known discharge.  Recently took out NuvaRing and has had some mild cramping which she is unsure if related to possible UTI versus menstrual cramping.  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      Home Medications    Prior to Admission medications   Medication Sig Start Date End Date Taking? Authorizing Provider  Cholecalciferol (DIALYVITE VITAMIN D 5000 PO) Take by mouth.   Yes [provider]  etonogestrel-ethinyl estradiol (NUVARING) 0.12-0.015 MG/24HR vaginal ring Place 1 each vaginally every 28 (twenty-eight) days. Insert vaginally and leave in place for 3 consecutive weeks, then remove for 1 week.   Yes [provider]  ibuprofen (ADVIL) 800 MG tablet Take by mouth. 06/29/14  Yes [provider]  MULTIPLE VITAMIN PO Take by mouth. [employer] metabolism , one daily   Yes  [provider]  nitrofurantoin, macrocrystal-monohydrate, (MACROBID) 100 MG capsule Take 1 capsule (100 mg total) by mouth 2 (two) times daily for 5 days. 12/19/20 12/24/20 Yes Jasenia Weilbacher, Junius Creamer, PA-C    Family History Family History  Problem Relation Age of Onset   Colon cancer Mother        Late 60s   Heart disease Mother    Kidney failure Mother    Hypertension Father    COPD Maternal Grandmother    Arthritis Maternal Grandmother    Stroke Maternal Grandfather    Heart disease Maternal Grandfather    Prostate cancer Maternal Grandfather    Heart attack Paternal Grandmother    Dementia Paternal Grandfather     Social History Social History   Tobacco Use   Smoking status: Never Smoker   Smokeless tobacco: Never Used  Building services engineer Use: Never used  Substance Use Topics   Alcohol use: Yes    Comment: occasional   Drug use: Never     Allergies   Patient has no known allergies.   Review of Systems Review of Systems  Constitutional: Negative for fever.  Respiratory: Negative for shortness of breath.   Cardiovascular: Negative for chest pain.  Gastrointestinal: Negative for abdominal pain, diarrhea, nausea and vomiting.  Genitourinary: Positive for dysuria and flank pain. Negative for genital sores, hematuria, menstrual problem, vaginal bleeding, vaginal discharge and vaginal pain.  Musculoskeletal: Negative for back pain.  Skin: Negative for rash.  Neurological: Negative for dizziness, light-headedness and headaches.     Physical Exam Triage Vital Signs ED Triage Vitals  Enc Vitals Group     BP      Pulse      Resp      Temp      Temp src      SpO2      Weight      Height      Head Circumference      Peak Flow      Pain Score      Pain Loc      Pain Edu?      Excl. in GC?    No data found.  Updated Vital Signs BP 117/80 (BP Location: Left Arm)    Pulse 61    Temp 98 F (36.7 C) (Oral)    Resp 17    SpO2 98%    Visual Acuity Right Eye Distance:   Left Eye Distance:   Bilateral Distance:    Right Eye Near:   Left Eye Near:    Bilateral Near:     Physical Exam Vitals and nursing note reviewed.  Constitutional:      Appearance: She is well-developed and well-nourished.     Comments: No acute distress  HENT:     Head: Normocephalic and atraumatic.     Nose: Nose normal.  Eyes:     Conjunctiva/sclera: Conjunctivae normal.  Cardiovascular:     Rate and Rhythm: Normal rate.  Pulmonary:     Effort: Pulmonary effort is normal. No respiratory distress.  Abdominal:     General: There is no distension.  Musculoskeletal:        General: Normal range of motion.     Cervical back: Neck supple.  Skin:    General: Skin is warm and dry.  Neurological:     Mental Status: She is alert and oriented to person, place, and time.  Psychiatric:        Mood and Affect: Mood and affect normal.      UC Treatments / Results  Labs (all labs ordered are listed, but only abnormal results are displayed) Labs Reviewed  POCT URINALYSIS DIP (MANUAL ENTRY) - Abnormal; Notable for the following components:      Result Value   Clarity, UA turbid (*)    Bilirubin, UA small (*)    Ketones, POC UA trace (5) (*)    Spec Grav, UA >=1.030 (*)    Blood, UA large (*)    Protein Ur, POC >=300 (*)    Nitrite, UA Positive (*)    Leukocytes, UA Trace (*)    All other components within normal limits  URINE CULTURE  POCT URINE PREGNANCY  CERVICOVAGINAL ANCILLARY ONLY    EKG   Radiology No results found.  Procedures Procedures (including critical care time)  Medications Ordered in UC Medications - No data to display  Initial Impression / Assessment and Plan / UC Course  I have reviewed the triage vital signs and the nursing notes.  Pertinent labs & imaging results that were available during my care of the patient were reviewed by me and considered in my medical decision making (see chart for  details).    UA consistent with UTI, pregnancy test negative, treated with Macrobid, urine culture pending.  Did opt to go ahead and obtain vaginal swab to screen for any vaginal infection contributing to symptoms as well  given reported vaginal discomfort.  Discussed strict return precautions. Patient verbalized understanding and is agreeable with plan.  Final Clinical Impressions(s) / UC Diagnoses   Final diagnoses:  Lower urinary tract infection, acute     Discharge Instructions     Urine showed evidence of infection. We are treating you with Macrobid twice daily for 5 days. Be sure to take full course. Stay hydrated- urine should be pale yellow to clear.   Swab pending to screen for any vaginal infections contributing to symptoms.  Please return or follow up with your primary provider if symptoms not improving with treatment. Please return sooner if you have worsening of symptoms or develop fever, nausea, vomiting, abdominal pain, back pain, lightheadedness, dizziness.    ED Prescriptions    Medication Sig Dispense Auth. Provider   nitrofurantoin, macrocrystal-monohydrate, (MACROBID) 100 MG capsule Take 1 capsule (100 mg total) by mouth 2 (two) times daily for 5 days. 10 capsule Carel Carrier, [place] C, PA-C     PDMP not reviewed this encounter.   Lew Dawes, [place] 12/19/20 1907

## 2020-12-19 NOTE — Discharge Instructions (Addendum)
Urine showed evidence of infection. We are treating you with Macrobid twice daily for 5 days. Be sure to take full course. Stay hydrated- urine should be pale yellow to clear.   Swab pending to screen for any vaginal infections contributing to symptoms.  Please return or follow up with your primary provider if symptoms not improving with treatment. Please return sooner if you have worsening of symptoms or develop fever, nausea, vomiting, abdominal pain, back pain, lightheadedness, dizziness.

## 2020-12-20 ENCOUNTER — Ambulatory Visit: Payer: Self-pay

## 2020-12-20 NOTE — Progress Notes (Signed)
This encounter was created in error - please disregard.

## 2020-12-21 LAB — URINE CULTURE: Culture: >=100000 — AB

## 2020-12-22 ENCOUNTER — Encounter: Payer: Self-pay | Admitting: Internal Medicine

## 2020-12-22 LAB — URINE CULTURE

## 2020-12-24 NOTE — Telephone Encounter (Signed)
FYI

## 2020-12-26 LAB — MOLECULAR ANCILLARY ONLY
Bacterial Vaginitis (gardnerella): NEGATIVE
Candida Glabrata: NEGATIVE
Candida Vaginitis: NEGATIVE
Chlamydia: NEGATIVE
Comment: NEGATIVE
Comment: NEGATIVE
Comment: NEGATIVE
Comment: NEGATIVE
Comment: NEGATIVE
Comment: NORMAL
Neisseria Gonorrhea: NEGATIVE
Trichomonas: NEGATIVE

## 2020-12-29 ENCOUNTER — Ambulatory Visit
Admission: RE | Admit: 2020-12-29 | Discharge: 2020-12-29 | Disposition: A | Discharge status: Home / Self Care | Payer: No Typology Code available for payment source | Source: Ambulatory Visit | Attending: Urgent Care | Admitting: Urgent Care

## 2020-12-29 ENCOUNTER — Other Ambulatory Visit: Payer: Self-pay

## 2020-12-29 VITALS — BP 110/76 | HR 63 | Temp 98.3°F | Resp 20 | Ht 66.0 in | Wt 200.0 lb

## 2020-12-29 DIAGNOSIS — N3001 Acute cystitis with hematuria: Secondary | ICD-10-CM | POA: Insufficient documentation

## 2020-12-29 DIAGNOSIS — R300 Dysuria: Secondary | ICD-10-CM | POA: Insufficient documentation

## 2020-12-29 LAB — POCT URINALYSIS DIP (MANUAL ENTRY)
Bilirubin, UA: NEGATIVE
Glucose, UA: NEGATIVE mg/dL
Ketones, POC UA: NEGATIVE mg/dL
Nitrite, UA: NEGATIVE
Protein Ur, POC: 30 mg/dL — AB
Spec Grav, UA: 1.025 (ref 1.010–1.025)
Urobilinogen, UA: 0.2 E.U./dL
pH, UA: 7 (ref 5.0–8.0)

## 2020-12-29 LAB — POCT URINE PREGNANCY: Preg Test, Ur: NEGATIVE

## 2020-12-29 MED ORDER — CEPHALEXIN 500 MG PO CAPS: 500.0000 mg | ORAL_CAPSULE | Freq: Two times a day (BID) | ORAL | 0 refills | Status: DC

## 2020-12-29 NOTE — ED Provider Notes (Signed)
[facility]   MRN: [id number] DOB: Feb 06, 1981  Subjective:   Emma Reynolds is a 40 y.o. female presenting for several day history of recurrent persistent dysuria, urinary frequency.  Patient was last seen about 2 weeks ago on 12/19/2020.  She tested positive for urinary tract infection, resulted in E. coli that was sensitive to all antibiotics.  Patient completed 5-day course of Macrobid but had persistent symptoms and feels like the whole UTI is returning again.  Patient tries to hydrate but does not drink enough.  She also drinks fruit juices.  No current facility-administered medications for this encounter.  Current Outpatient Medications:  .  Cholecalciferol (DIALYVITE VITAMIN D 5000 PO), Take by mouth., Disp: , Rfl:  .  etonogestrel-ethinyl estradiol (NUVARING) 0.12-0.015 MG/24HR vaginal ring, Place 1 each vaginally every 28 (twenty-eight) days. Insert vaginally and leave in place for 3 consecutive weeks, then remove for 1 week., Disp: , Rfl:  .  MULTIPLE VITAMIN PO, Take by mouth. [employer] metabolism , one daily, Disp: , Rfl:  .  ibuprofen (ADVIL) 800 MG tablet, Take by mouth., Disp: , Rfl:    No Known Allergies  Past Medical History:  Diagnosis Date  . Anxiety   . Fatty liver   . Gallstones   . Gilbert's syndrome 05/29/2018     Past Surgical History:  Procedure Laterality Date  . BUNIONECTOMY    . CHOLECYSTECTOMY      Family History  Problem Relation Age of Onset  . Colon cancer Mother        Late 27s  . Heart disease Mother   . Kidney failure Mother   . Hypertension Father   . COPD Maternal Grandmother   . Arthritis Maternal Grandmother   . Stroke Maternal Grandfather   . Heart disease Maternal Grandfather   . Prostate cancer Maternal Grandfather   . Heart attack Paternal Grandmother   . Dementia Paternal Grandfather     Social History   Tobacco Use  . Smoking status: Never Smoker  . Smokeless tobacco: Never Used  Vaping Use  . Vaping  Use: Never used  Substance Use Topics  . Alcohol use: Yes    Comment: occasional  . Drug use: Never    ROS   Objective:   Vitals: BP 110/76 (BP Location: Left Arm)   Pulse 63   Temp 98.3 F (36.8 C) (Oral)   Resp 20   Ht 5\' 6"  (1.676 m)   Wt 200 lb (90.7 kg)   LMP 12/11/2020 (Exact Date)   SpO2 96%   BMI 32.28 kg/m   Physical Exam Constitutional:      General: She is not in acute distress.    Appearance: Normal appearance. She is well-developed. She is not ill-appearing.  HENT:     Head: Normocephalic and atraumatic.     Nose: Nose normal.     Mouth/Throat:     Mouth: Mucous membranes are moist.     Pharynx: Oropharynx is clear.  Eyes:     General: No scleral icterus.    Extraocular Movements: Extraocular movements intact.     Pupils: Pupils are equal, round, and reactive to light.  Cardiovascular:     Rate and Rhythm: Normal rate.  Pulmonary:     Effort: Pulmonary effort is normal.  Abdominal:     Tenderness: There is no right CVA tenderness or left CVA tenderness.  Skin:    General: Skin is warm and dry.  Neurological:     General: No  focal deficit present.     Mental Status: She is alert and oriented to person, place, and time.  Psychiatric:        Mood and Affect: Mood normal.        Behavior: Behavior normal.        Thought Content: Thought content normal.        Judgment: Judgment normal.     Results for orders placed or performed during the hospital encounter of 12/29/20 (from the past 24 hour(s))  POCT urinalysis dipstick     Status: Abnormal   Collection Time: 12/29/20 12:34 PM  Result Value Ref Range   Color, UA yellow yellow   Clarity, UA cloudy (A) clear   Glucose, UA negative negative mg/dL   Bilirubin, UA negative negative   Ketones, POC UA negative negative mg/dL   Spec Grav, UA 3.710 6.269 - 1.025   Blood, UA large (A) negative   pH, UA 7.0 5.0 - 8.0   Protein Ur, POC =30 (A) negative mg/dL   Urobilinogen, UA 0.2 0.2 or 1.0  E.U./dL   Nitrite, UA Negative Negative   Leukocytes, UA Large (3+) (A) Negative  POCT urine pregnancy     Status: None   Collection Time: 12/29/20 12:34 PM  Result Value Ref Range   Preg Test, Ur Negative Negative    Assessment and Plan :   PDMP not reviewed this encounter.  1. Acute cystitis with hematuria   2. Dysuria     Suspect ongoing cystitis, no signs of pyelonephritis.  Start Keflex, urine culture pending.  Recommended consistent daily hydration, limiting urinary irritants. Counseled patient on potential for adverse effects with medications prescribed/recommended today, ER and return-to-clinic precautions discussed, patient verbalized understanding.    Wallis Bamberg, PA-C 12/29/20 1259

## 2020-12-29 NOTE — Discharge Instructions (Addendum)
Make sure you hydrate very well with plain water and a quantity of 64 ounces of water a day.  Please limit drinks that are considered urinary irritants such as soda, sweet tea, coffee, energy drinks, alcohol, over-the-counter fruit juices like cranberry juice, apple juice.  These can worsen your urinary and genital symptoms but also be the source of them.  I will let you know about your urine culture results through MyChart to see if we need to prescribe or change your antibiotics based off of those results.

## 2020-12-29 NOTE — ED Triage Notes (Signed)
Pt was seen here last week for UTI and completed antibiotic course. Today she c/o return of symptoms of painful urination and frequent urination. Pain 4/10

## 2020-12-30 LAB — URINE CULTURE

## 2021-04-10 ENCOUNTER — Encounter: Payer: BC Managed Care – PPO | Admitting: Internal Medicine

## 2021-05-28 DIAGNOSIS — Z20822 Contact with and (suspected) exposure to covid-19: Secondary | ICD-10-CM | POA: Diagnosis not present

## 2021-06-04 ENCOUNTER — Ambulatory Visit (HOSPITAL_COMMUNITY)
Admission: EM | Admit: 2021-06-04 | Discharge: 2021-06-04 | Disposition: A | Discharge status: Home / Self Care | Payer: No Typology Code available for payment source | Attending: Internal Medicine | Admitting: Internal Medicine

## 2021-06-04 ENCOUNTER — Encounter (HOSPITAL_COMMUNITY): Payer: Self-pay

## 2021-06-04 ENCOUNTER — Other Ambulatory Visit: Payer: Self-pay

## 2021-06-04 ENCOUNTER — Ambulatory Visit (INDEPENDENT_AMBULATORY_CARE_PROVIDER_SITE_OTHER): Payer: No Typology Code available for payment source

## 2021-06-04 DIAGNOSIS — M25572 Pain in left ankle and joints of left foot: Secondary | ICD-10-CM

## 2021-06-04 DIAGNOSIS — W19XXXA Unspecified fall, initial encounter: Secondary | ICD-10-CM | POA: Diagnosis not present

## 2021-06-04 DIAGNOSIS — M7989 Other specified soft tissue disorders: Secondary | ICD-10-CM | POA: Diagnosis not present

## 2021-06-04 MED ORDER — ETODOLAC 400 MG PO TABS: 400.0000 mg | ORAL_TABLET | Freq: Two times a day (BID) | ORAL | 0 refills | Status: DC

## 2021-06-04 MED ORDER — KETOROLAC TROMETHAMINE 60 MG/2ML IM SOLN
INTRAMUSCULAR | Status: AC
  Filled 2021-06-04: qty 2

## 2021-06-04 MED ORDER — KETOROLAC TROMETHAMINE 60 MG/2ML IM SOLN
60.0000 mg | Freq: Once | INTRAMUSCULAR | Status: AC
  Administered 2021-06-04: 60 mg via INTRAMUSCULAR

## 2021-06-04 NOTE — ED Triage Notes (Signed)
Pt presents with pain and swelling in the left ankle x 3 hers. Reports she was running and fell and twisted her left ankle. States she can not put pressure on.

## 2021-06-04 NOTE — ED Provider Notes (Signed)
[facility]    CSN: [id number] Arrival date & time: 06/04/21  1323      History   Chief Complaint Chief Complaint  Patient presents with   Ankle Pain     HPI Shalita Notte is a 40 y.o. female.   HPI  Ankle Pain: Patient reports that this morning she was jogging when she looked into her neighbors yard and rolled her left ankle.  She felt immediate significant pain.  She states that following this she had a hard time ambulating on the area and still has significant pain.  She has noticed swelling of her ankle especially on the lateral aspect as well.  She has tried rice with little improvement.  No neuro changes, bruising or skin breakdown. 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      Home Medications    Prior to Admission medications   Medication Sig Start Date End Date Taking? Authorizing Provider  cephALEXin (KEFLEX) 500 MG capsule Take 1 capsule (500 mg total) by mouth 2 (two) times daily. 12/29/20   Wallis Bamberg, PA-C  Cholecalciferol (DIALYVITE VITAMIN D 5000 PO) Take by mouth.    [provider]  etonogestrel-ethinyl estradiol (NUVARING) 0.12-0.015 MG/24HR vaginal ring Place 1 each vaginally every 28 (twenty-eight) days. Insert vaginally and leave in place for 3 consecutive weeks, then remove for 1 week.    [provider]  ibuprofen (ADVIL) 800 MG tablet Take by mouth. 06/29/14   [provider]  MULTIPLE VITAMIN PO Take by mouth.  [employer] metabolism , one daily    [provider]    Family History Family History  Problem Relation Age of Onset   Colon cancer Mother        Late 27s   Heart disease Mother    Kidney failure Mother    Hypertension Father    COPD Maternal Grandmother    Arthritis Maternal Grandmother    Stroke Maternal Grandfather    Heart disease Maternal Grandfather    Prostate cancer Maternal Grandfather    Heart attack Paternal Grandmother    Dementia Paternal Grandfather     Social History Social History   Tobacco Use   Smoking status: Never   Smokeless tobacco: Never  Vaping Use   Vaping Use: Never used  Substance Use Topics   Alcohol use: Yes    Comment: occasional   Drug use: Never     Allergies   Patient has no known allergies.   Review of Systems Review of Systems  As stated above in HPI Physical Exam Triage Vital Signs ED Triage Vitals  Enc Vitals Group     BP      Pulse      Resp      Temp      Temp src      SpO2      Weight      Height  Head Circumference      Peak Flow      Pain Score      Pain Loc      Pain Edu?      Excl. in GC?    No data found.  Updated Vital Signs BP (!) 113/57 (BP Location: Right Arm)   Pulse 66   Temp 99.4 F (37.4 C) (Oral)   Resp 18   LMP 06/02/2021 (Exact Date)   SpO2 100%   Physical Exam Vitals and nursing note reviewed.  Constitutional:      Appearance: Normal appearance. She is obese.  Cardiovascular:     Pulses: Normal pulses.  Musculoskeletal:        General: Swelling and tenderness (lateral left ankle) present. Normal range of motion.  Skin:    General: Skin is warm.     Findings: No bruising or rash.  Neurological:     General: No focal deficit present.     Mental Status: She is alert.     Sensory: No sensory deficit.     Motor: No weakness.     Coordination: Coordination normal.     UC Treatments / Results  Labs (all labs ordered are listed, but only abnormal results are  displayed) Labs Reviewed - No data to display  EKG   Radiology DG Ankle Complete Left  Result Date: 06/04/2021 CLINICAL DATA:  Pain and swelling after fall EXAM: LEFT ANKLE COMPLETE - 3+ VIEW COMPARISON:  None. FINDINGS: Soft tissue swelling at the ankle particularly at the lateral malleolus. Alignment is anatomic. There is no acute fracture. Joint spaces are preserved. IMPRESSION: No acute fracture. Electronically Signed   By: Guadlupe Spanish M.D.   On: 06/04/2021 14:20    Procedures Procedures (including critical care time)  Medications Ordered in UC Medications  ketorolac (TORADOL) injection 60 mg (has no administration in time range)    Initial Impression / Assessment and Plan / UC Course  I have reviewed the triage vital signs and the nursing notes.  Pertinent labs & imaging results that were available during my care of the patient were reviewed by me and considered in my medical decision making (see chart for details).     New.  Her x-ray does not show any evidence of fracture or significant injury.  Likely a bad sprain.  We discussed nonweightbearing status for 2 weeks.  We discussed return to activity slowly.  We discussed the benefits of RICE along with NSAID medication.  We also discussed tramadol which she is agreeable with today.  We will send her home with crutches and a walking boot which is her preference.  We discussed orthopedics as follow-up as needed.  We also reviewed that it can take 6 to 8 weeks for a sprain to fully resolve and that she is more likely to get continuous sprains in the future. Final Clinical Impressions(s) / UC Diagnoses   Final diagnoses:  None   Discharge Instructions   None    ED Prescriptions   None    PDMP not reviewed this encounter.   Rushie Chestnut, [place] 06/04/21 1438

## 2021-07-11 ENCOUNTER — Other Ambulatory Visit: Payer: Self-pay

## 2021-07-11 ENCOUNTER — Ambulatory Visit
Admission: RE | Admit: 2021-07-11 | Discharge: 2021-07-11 | Disposition: A | Discharge status: Home / Self Care | Payer: BC Managed Care – PPO | Source: Ambulatory Visit | Attending: Emergency Medicine | Admitting: Emergency Medicine

## 2021-07-11 VITALS — BP 118/81 | HR 66 | Temp 98.7°F | Resp 15

## 2021-07-11 DIAGNOSIS — N390 Urinary tract infection, site not specified: Secondary | ICD-10-CM | POA: Insufficient documentation

## 2021-07-11 LAB — POCT URINALYSIS DIP (MANUAL ENTRY)
Bilirubin, UA: NEGATIVE
Glucose, UA: NEGATIVE mg/dL
Ketones, POC UA: NEGATIVE mg/dL
Nitrite, UA: NEGATIVE
Protein Ur, POC: 30 mg/dL — AB
Spec Grav, UA: 1.015 (ref 1.010–1.025)
Urobilinogen, UA: 0.2 E.U./dL
pH, UA: 8.5 — AB (ref 5.0–8.0)

## 2021-07-11 LAB — POCT URINE PREGNANCY: Preg Test, Ur: NEGATIVE

## 2021-07-11 MED ORDER — NITROFURANTOIN MONOHYD MACRO 100 MG PO CAPS: 100.0000 mg | ORAL_CAPSULE | Freq: Two times a day (BID) | ORAL | 0 refills | Status: AC

## 2021-07-11 NOTE — ED Triage Notes (Signed)
Patient c/o urinary frequency and dysuria x 2 days.   Patient denies foul smelling urine, hematuria, back pain, vaginal discharge, and ABD.   Patient has tried to increase fluid intake with no relief of symptoms.

## 2021-07-11 NOTE — Discharge Instructions (Addendum)

## 2021-07-11 NOTE — ED Provider Notes (Signed)
Subjective:    Emma Reynolds is a very pleasant 40 y.o. female who presents with concerns for UTI due to dysuria and frequency over the last couple of days.  Patient states that she has increased her fluid intake, but that this has not helped her symptoms.  Patient does not report the use of any over-the-counter remedies such as Azo or Uristat.. No unilateral back pain, vomiting, fever, vaginal discharge, concern for STD.  Past medical history, past surgical history, current medications reviewed.  Allergies: has No Known Allergies.  Review of Systems See HPI   Objective:     Vitals:   07/11/21 1120  BP: 118/81  Pulse: 66  Resp: 15  Temp: 98.7 F (37.1 C)  SpO2: 96%     General: Appears well-developed and well-nourished. No acute distress.  Cardiovascular: Normal rate  Pulm/Chest: No respiratory distress. Abdominal: No CVAT.  Neurological: Alert and oriented to person, place, and time.  Skin: Skin is warm and dry.  Psychiatric: Normal mood, affect, behavior, and thought content.  GU:  Deferred secondary to self collect specimen  Laboratory:  Orders Placed This Encounter  Procedures   Urine Culture   POCT urinalysis dipstick   POCT urine pregnancy   Results for orders placed or performed during the hospital encounter of 07/11/21  POCT urinalysis dipstick  Result Value Ref Range   Color, UA yellow yellow   Clarity, UA clear clear   Glucose, UA negative negative mg/dL   Bilirubin, UA negative negative   Ketones, POC UA negative negative mg/dL   Spec Grav, UA 3.810 1.751 - 1.025   Blood, UA trace-intact (A) negative   pH, UA 8.5 (A) 5.0 - 8.0   Protein Ur, POC =30 (A) negative mg/dL   Urobilinogen, UA 0.2 0.2 or 1.0 E.U./dL   Nitrite, UA Negative Negative   Leukocytes, UA Trace (A) Negative  POCT urine pregnancy  Result Value Ref Range   Preg Test, Ur Negative Negative     Assessment:   1. Acute UTI - Urine Culture; Standing - Urine Culture - nitrofurantoin,  macrocrystal-monohydrate, (MACROBID) 100 MG capsule; Take 1 capsule (100 mg total) by mouth 2 (two) times daily for 5 days.  Dispense: 10 capsule; Refill: 0  Plan:   MDM: Patient presents with concerns for UTI due to dysuria and frequency over the last couple of days.  Patient states that she has increased her fluid intake, but that this has not helped her symptoms.  Patient does not report the use of any over-the-counter remedies such as Azo or Uristat.. No unilateral back pain, vomiting, fever, vaginal discharge, concern for STD.  Chart review completed.  hCG negative.  Urinalysis reveals trace blood, trace leukocytes, 30 protein, mildly increased pH.  Urine culture pending.  Given symptoms along with urinalysis, likely acute UTI.  No concern for pyelonephritis or complicated UTI at this time.  No concern for kidney involvement.  Rx Macrobid to the patient's preferred pharmacy to treat infection.  Advised about home treatment and care to include increasing fluid intake.  Also advised that she may use over-the-counter Azo or Uristat if needed to help with discomfort.  Advised of strict return precautions suggestive of worsening infection as outlined in her AVS to include unilateral back pain, fever or vomiting.  Patient verbalized understanding and agreed with plan.  Patient stable upon discharge.  Return as needed.    Discharge Instructions      You were seen today for an infection in the lower urinary  tract. Your urine sample today was sent for a culture. The urine culture will show what type of bacteria grows and if you are on the appropriate antibiotic. If the antibiotic needs to be changed, you will receive a phone call from the follow up nurse who will give you more information. If you do not receive a call, then you are on the correct antibiotic.  Take antibiotics as directed. Finish course even if feeling better sooner. Drink plenty of clear fluids. Over the counter "Uristat" or "Azo  Standard" may help your discomfort.  This will turn your urine dark orange or red and may stain contacts (remove before using). You may experience 24 - 48 hours of continuing discomfort until medication controls the infection.  Return to clinic or go to the ER if you develop a fever, one-sided back pain, or vomiting as these are signs of a worsening infection.          Amalia Greenhouse, FNP 07/11/21 1148

## 2021-07-13 LAB — URINE CULTURE: Culture: 50000 — AB

## 2021-07-24 DIAGNOSIS — L739 Follicular disorder, unspecified: Secondary | ICD-10-CM | POA: Insufficient documentation

## 2021-07-24 DIAGNOSIS — L810 Postinflammatory hyperpigmentation: Secondary | ICD-10-CM | POA: Diagnosis not present

## 2021-08-06 DIAGNOSIS — Z3200 Encounter for pregnancy test, result unknown: Secondary | ICD-10-CM | POA: Diagnosis not present

## 2021-08-13 ENCOUNTER — Encounter: Payer: No Typology Code available for payment source | Admitting: Internal Medicine

## 2021-08-20 DIAGNOSIS — O020 Blighted ovum and nonhydatidiform mole: Secondary | ICD-10-CM | POA: Diagnosis not present

## 2021-09-22 DIAGNOSIS — O039 Complete or unspecified spontaneous abortion without complication: Secondary | ICD-10-CM | POA: Diagnosis not present

## 2021-10-20 ENCOUNTER — Encounter: Payer: Self-pay | Admitting: Nurse Practitioner

## 2021-10-20 ENCOUNTER — Other Ambulatory Visit: Payer: Self-pay

## 2021-10-20 ENCOUNTER — Ambulatory Visit (INDEPENDENT_AMBULATORY_CARE_PROVIDER_SITE_OTHER): Payer: BC Managed Care – PPO | Admitting: Nurse Practitioner

## 2021-10-20 VITALS — BP 128/70 | HR 59 | Temp 98.2°F | Ht 65.8 in | Wt 204.4 lb

## 2021-10-20 DIAGNOSIS — F419 Anxiety disorder, unspecified: Secondary | ICD-10-CM | POA: Diagnosis not present

## 2021-10-20 DIAGNOSIS — Z23 Encounter for immunization: Secondary | ICD-10-CM

## 2021-10-20 DIAGNOSIS — R635 Abnormal weight gain: Secondary | ICD-10-CM

## 2021-10-20 DIAGNOSIS — Z1159 Encounter for screening for other viral diseases: Secondary | ICD-10-CM

## 2021-10-20 DIAGNOSIS — E559 Vitamin D deficiency, unspecified: Secondary | ICD-10-CM | POA: Diagnosis not present

## 2021-10-20 DIAGNOSIS — R4189 Other symptoms and signs involving cognitive functions and awareness: Secondary | ICD-10-CM

## 2021-10-20 DIAGNOSIS — E6609 Other obesity due to excess calories: Secondary | ICD-10-CM

## 2021-10-20 DIAGNOSIS — R748 Abnormal levels of other serum enzymes: Secondary | ICD-10-CM | POA: Diagnosis not present

## 2021-10-20 DIAGNOSIS — E7800 Pure hypercholesterolemia, unspecified: Secondary | ICD-10-CM

## 2021-10-20 DIAGNOSIS — Z7689 Persons encountering health services in other specified circumstances: Secondary | ICD-10-CM

## 2021-10-20 DIAGNOSIS — Z6833 Body mass index (BMI) 33.0-33.9, adult: Secondary | ICD-10-CM

## 2021-10-20 NOTE — Patient Instructions (Addendum)
Cholesterol Content in Foods Cholesterol is a waxy, fat-like substance that helps to carry fat in the blood. The body needs cholesterol in small amounts, but too much cholesterol can cause damage to the arteries and heart. What foods have cholesterol? Cholesterol is found in animal-based foods, such as meat, seafood, and dairy. Generally, low-fat dairy and lean meats have less cholesterol than full-fat dairy and fatty meats. The milligrams of cholesterol per serving (mg per serving) of common cholesterol-containing foods are listed below. Meats and other proteins Egg -- one large whole egg has 186 mg. Veal shank -- 4 oz (113 g) has 141 mg. Lean ground [place] (93% lean) -- 4 oz (113 g) has 118 mg. Fat-trimmed lamb loin -- 4 oz (113 g) has 106 mg. Lean ground beef (90% lean) -- 4 oz (113 g) has 100 mg. Lobster -- 3.5 oz (99 g) has 90 mg. Pork loin chops -- 4 oz (113 g) has 86 mg. Canned salmon -- 3.5 oz (99 g) has 83 mg. Fat-trimmed beef top loin -- 4 oz (113 g) has 78 mg. Frankfurter -- 1 frank (3.5 oz or 99 g) has 77 mg. Crab -- 3.5 oz (99 g) has 71 mg. Roasted chicken without skin, white meat -- 4 oz (113 g) has 66 mg. Light bologna -- 2 oz (57 g) has 45 mg. Deli-cut [place] -- 2 oz (57 g) has 31 mg. Canned tuna -- 3.5 oz (99 g) has 31 mg. Berniece Salines -- 1 oz (28 g) has 29 mg. Oysters and mussels (raw) -- 3.5 oz (99 g) has 25 mg. Mackerel -- 1 oz (28 g) has 22 mg. Trout -- 1 oz (28 g) has 20 mg. Pork sausage -- 1 link (1 oz or 28 g) has 17 mg. Salmon -- 1 oz (28 g) has 16 mg. Tilapia -- 1 oz (28 g) has 14 mg. Dairy Soft-serve ice cream --  cup (4 oz or 86 g) has 103 mg. Whole-milk yogurt -- 1 cup (8 oz or 245 g) has 29 mg. Cheddar cheese -- 1 oz (28 g) has 28 mg. American cheese -- 1 oz (28 g) has 28 mg. Whole milk -- 1 cup (8 oz or 250 mL) has 23 mg. 2% milk -- 1 cup (8 oz or 250 mL) has 18 mg. Cream cheese -- 1 tablespoon (Tbsp) (14.5 g) has 15 mg. Cottage cheese --  cup (4 oz or 113  g) has 14 mg. Low-fat (1%) milk -- 1 cup (8 oz or 250 mL) has 10 mg. Sour cream -- 1 Tbsp (12 g) has 8.5 mg. Low-fat yogurt -- 1 cup (8 oz or 245 g) has 8 mg. Nonfat Greek yogurt -- 1 cup (8 oz or 228 g) has 7 mg. Half-and-half cream -- 1 Tbsp (15 mL) has 5 mg. Fats and oils Cod liver oil -- 1 tablespoon (Tbsp) (13.6 g) has 82 mg. Butter -- 1 Tbsp (14 g) has 15 mg. Lard -- 1 Tbsp (12.8 g) has 14 mg. Bacon grease -- 1 Tbsp (12.9 g) has 14 mg. Mayonnaise -- 1 Tbsp (13.8 g) has 5-10 mg. Margarine -- 1 Tbsp (14 g) has 3-10 mg. The items listed above may not be a complete list of foods with cholesterol. Exact amounts of cholesterol in these foods may vary depending on specific ingredients and brands. Contact a dietitian for more information. What foods do not have cholesterol? Most plant-based foods do not have cholesterol unless you combine them with a food that has cholesterol.  Foods without cholesterol include: Grains and cereals. Vegetables. Fruits. Vegetable oils, such as olive, canola, and sunflower oil. Legumes, such as peas, beans, and lentils. Nuts and seeds. Egg whites. The items listed above may not be a complete list of foods that do not have cholesterol. Contact a dietitian for more information. Summary The body needs cholesterol in small amounts, but too much cholesterol can cause damage to the arteries and heart. Cholesterol is found in animal-based foods, such as meat, seafood, and dairy. Generally, low-fat dairy and lean meats have less cholesterol than full-fat dairy and fatty meats. This information is not intended to replace advice given to you by your health care provider. Make sure you discuss any questions you have with your health care provider. Document Revised: 04/04/2021 Document Reviewed: 04/04/2021 Elsevier Patient Education  2022 [employer].  Exercising to Lose Weight Getting regular exercise is important for everyone. It is especially important if you are  overweight. Being overweight increases your risk of heart disease, stroke, diabetes, high blood pressure, and several types of cancer. Exercising, and reducing the calories you consume, can help you lose weight and improve fitness and health. Exercise can be moderate or vigorous intensity. To lose weight, most people need to do a certain amount of moderate or vigorous-intensity exercise each week. How can exercise affect me? You lose weight when you exercise enough to burn more calories than you eat. Exercise also reduces body fat and builds muscle. The more muscle you have, the more calories you burn. Exercise also: Improves mood. Reduces stress and tension. Improves your overall fitness, flexibility, and endurance. Increases bone strength. Moderate-intensity exercise Moderate-intensity exercise is any activity that gets you moving enough to burn at least three times more energy (calories) than if you were sitting. Examples of moderate exercise include: Walking a mile in 15 minutes. Doing light yard work. Biking at an easy pace. Most people should get at least 150 minutes of moderate-intensity exercise a week to maintain their body weight. Vigorous-intensity exercise Vigorous-intensity exercise is any activity that gets you moving enough to burn at least six times more calories than if you were sitting. When you exercise at this intensity, you should be working hard enough that you are not able to carry on a conversation. Examples of vigorous exercise include: Running. Playing a team sport, such as football, basketball, and soccer. Jumping rope. Most people should get at least 75 minutes a week of vigorous exercise to maintain their body weight. What actions can I take to lose weight? The amount of exercise you need to lose weight depends on: Your age. The type of exercise. Any health conditions you have. Your overall physical ability. Talk to your health care provider about how much  exercise you need and what types of activities are safe for you. Nutrition  Make changes to your diet as told by your health care provider or diet and nutrition specialist (dietitian). This may include: Eating fewer calories. Eating more protein. Eating less unhealthy fats. Eating a diet that includes fresh fruits and vegetables, whole grains, low-fat dairy products, and lean protein. Avoiding foods with added fat, salt, and sugar. Drink plenty of water while you exercise to prevent dehydration or heat stroke. Activity Choose an activity that you enjoy and set realistic goals. Your health care provider can help you make an exercise plan that works for you. Exercise at a moderate or vigorous intensity most days of the week. The intensity of exercise may vary from person to person.  You can tell how intense a workout is for you by paying attention to your breathing and heartbeat. Most people will notice their breathing and heartbeat get faster with more intense exercise. Do resistance training twice each week, such as: Push-ups. Sit-ups. Lifting weights. Using resistance bands. Getting short amounts of exercise can be just as helpful as long, structured periods of exercise. If you have trouble finding time to exercise, try doing these things as part of your daily routine: Get up, stretch, and walk around every 30 minutes throughout the day. Go for a walk during your lunch break. Park your car farther away from your destination. If you take public transportation, get off one stop early and walk the rest of the way. Make phone calls while standing up and walking around. Take the stairs instead of elevators or escalators. Wear comfortable clothes and shoes with good support. Do not exercise so much that you hurt yourself, feel dizzy, or get very short of breath. Where to find more information [place] Department of Health and Human Services: ThisPath.fi Centers for Disease Control and Prevention:  [employer]Ex[employer].br Contact a health care provider: Before starting a new exercise program. If you have questions or concerns about your weight. If you have a medical problem that keeps you from exercising. Get help right away if: You have any of the following while exercising: Injury. Dizziness. Difficulty breathing or shortness of breath that does not go away when you stop exercising. Chest pain. Rapid heartbeat. These symptoms may represent a serious problem that is an emergency. Do not wait to see if the symptoms will go away. Get medical help right away. Call your local emergency services (911 in the [place] Do not drive yourself to the hospital. Summary Getting regular exercise is especially important if you are overweight. Being overweight increases your risk of heart disease, stroke, diabetes, high blood pressure, and several types of cancer. Losing weight happens when you burn more calories than you eat. Reducing the amount of calories you eat, and getting regular moderate or vigorous exercise each week, helps you lose weight. This information is not intended to replace advice given to you by your health care provider. Make sure you discuss any questions you have with your health care provider. Document Revised: 01/19/2021 Document Reviewed: 01/19/2021 Elsevier Patient Education  2022 [employer].

## 2021-10-20 NOTE — Progress Notes (Signed)
I,Tianna Badgett,acting as a Education administrator for [employer], FNP.,have documented all relevant documentation on the behalf of Minette Brine, FNP,as directed by  Minette Brine, FNP while in the presence of Minette Brine, [place].  This visit occurred during the SARS-CoV-2 public health emergency.  Safety protocols were in place, including screening questions prior to the visit, additional usage of staff PPE, and extensive cleaning of exam room while observing appropriate contact time as indicated for disinfecting solutions.  Subjective:     Patient ID: Emma Reynolds , female    DOB: June 23, 1981 , 40 y.o.   MRN: [id number]   Chief Complaint  Patient presents with   Establish Care         HPI  Patient is here to establish care. She was last seen for primary care at [facility] but her provider moved to another practice, her last visit was last year in 2021.  She works for a Personnel officer. Married.   She stopped her birth control in March 2022, then in August they thought she had a blited ovum in September (9 weeks). She reports her weight has increased since spraining her ankle. She had a gallbladder removal in 2017 - she began gaining weight. 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 She has tried to lose weight, she was told her metabolism was slow. She was advised to do Keto. She has a 51 and 38 year old. She does admit to eating at [facility] and nibble on Mac and Cheese. Her weight was 160-170 lbs since Covid. She has been doing the indoor bike and was running in June and almost broke her ankle. She has not seen an orthopedic and could not walk for 3 weeks after and wears a compression band.   She has concerns with weight gain. She would like to discuss medications for weight loss as well as discuss natural ways. She would also like to discuss her concerns with her cholesterol levels. At her last appointment with her GYN she was  told she may need to start medication for her cholesterol.   Her mother was 76 when she was first diagnosed with colon cancer and she has had it twice.     Past Medical History:  Diagnosis Date   Anxiety    Fatty liver    Gallstones    Gilbert's syndrome 05/29/2018   Miscarriage      Family History  Problem Relation Age of Onset   Colon cancer Mother        Late 51s   Heart disease Mother    Kidney failure Mother    Hypertension Father    COPD Maternal Grandmother    Arthritis Maternal Grandmother    Stroke Maternal Grandfather    Heart disease Maternal Grandfather    Prostate cancer Maternal Grandfather    Heart attack Paternal Grandmother    Dementia Paternal Grandfather      Current Outpatient Medications:    Cholecalciferol (DIALYVITE VITAMIN D 5000 PO), Take by mouth., Disp: , Rfl:    phentermine 15 MG capsule, Take 1 capsule (15 mg total) by mouth every morning., Disp: 30 capsule, Rfl: 1   No Known Allergies   Review of Systems  Constitutional: Negative.   Respiratory: Negative.    Cardiovascular: Negative.   Gastrointestinal: Negative.   Skin:        She has seen a Dermatologist for her "skin picking" from her anxiety.  Neurological: Negative.   Psychiatric/Behavioral: Negative.         Tearful during visit     Today's Vitals   10/20/21 1139  BP: 128/70  Pulse: (!) 59  Temp: 98.2 F (36.8 C)  TempSrc: Oral  Weight: 204 lb 6.4 oz (92.7 kg)  Height: 5' 5.8" (1.671 m)   Body mass index is 33.19 kg/m.  Wt Readings from Last 3 Encounters:  10/20/21 204 lb 6.4 oz (92.7 kg)  12/29/20 200 lb (90.7 kg)  04/04/20 198 lb (89.8 kg)    Objective:  Physical Exam Vitals reviewed.  Constitutional:      General: She is not in acute distress.    Appearance: Normal appearance. She is well-developed. She is obese.  Cardiovascular:     Rate and Rhythm: Normal rate and regular rhythm.     Pulses: Normal pulses.     Heart sounds: Normal heart sounds. No  murmur heard. Pulmonary:     Effort: Pulmonary effort is normal. No respiratory distress.     Breath sounds: Normal breath sounds. No wheezing.  Chest:     Chest wall: No tenderness.  Musculoskeletal:        General: Normal range of motion.  Skin:    General: Skin is warm and dry.     Capillary Refill: Capillary refill takes less than 2 seconds.  Neurological:     General: No focal deficit present.     Mental Status: She is alert and oriented to person, place, and time.     Cranial Nerves: No cranial nerve deficit.     Motor: No weakness.  Psychiatric:        Mood and Affect: Mood normal.        Behavior: Behavior normal.        Thought Content: Thought content normal.        Judgment: Judgment normal.        Assessment And Plan:     1. Anxiety Comments: Not currently taking any medications recommended magnesium over the counter with evening meal.   2. Abnormal weight gain Comments: Had a discussion with her will check metabolic cause. At this time she does not want weight loss medications due to trying to concieve.  - TSH - Insulin, random - Amb Ref to Medical Weight Management  3. Elevated cholesterol - Lipid panel  4. Elevated liver enzymes Comments: She had elevated liver enzymes in the past and want them to be repeated.  - CMP14+EGFR  5. Vitamin D deficiency Will check vitamin D level and supplement as needed.    Also encouraged to spend 15 minutes in the sun daily.  - VITAMIN D 25 Hydroxy (Vit-D Deficiency, Fractures)  6. Brain fog Will check vitamin B12 - Vitamin B12  7. Need for influenza vaccination Influenza vaccine administered Encouraged to take Tylenol as needed for fever or muscle aches. - Flu Vaccine QUAD 6+ mos PF IM (Fluarix Quad PF)  8. Class 1 obesity due to excess calories without serious comorbidity with body mass index (BMI) of 33.0 to 33.9 in adult Chronic Discussed healthy diet and regular exercise options  Encouraged to exercise at  least 150 minutes per week with 2 days of strength training Discussed different medication options at this time wants to wait due to trying to concieve  9. Encounter for hepatitis C screening test for low risk patient - Hepatitis C antibody  10. Establishing care with new doctor, encounter for  She is encouraged to strive for  BMI less than 30 to decrease cardiac risk. Advised to aim for at least 150 minutes of exercise per week.    Patient was given opportunity to ask questions. Patient verbalized understanding of the plan and was able to repeat key elements of the plan. All questions were answered to their satisfaction.  Minette Brine, FNP   I, Minette Brine, FNP, have reviewed all documentation for this visit. The documentation on 10/20/21 for the exam, diagnosis, procedures, and orders are all accurate and complete.   IF YOU HAVE BEEN REFERRED TO A SPECIALIST, IT MAY TAKE 1-2 WEEKS TO SCHEDULE/PROCESS THE REFERRAL. IF YOU HAVE NOT HEARD FROM US/SPECIALIST IN TWO WEEKS, PLEASE GIVE [place] A CALL AT [phone].   THE PATIENT IS ENCOURAGED TO PRACTICE SOCIAL DISTANCING DUE TO THE COVID-19 PANDEMIC.

## 2021-10-23 LAB — CMP14+EGFR
ALT: 43 IU/L — ABNORMAL HIGH (ref 0–32)
AST: 18 IU/L (ref 0–40)
Albumin/Globulin Ratio: 1.4 (ref 1.2–2.2)
Albumin: 4.5 g/dL (ref 3.8–4.8)
Alkaline Phosphatase: 84 IU/L (ref 44–121)
BUN/Creatinine Ratio: 12 (ref 9–23)
BUN: 9 mg/dL (ref 6–20)
Bilirubin Total: 1.2 mg/dL (ref 0.0–1.2)
CO2: 24 mmol/L (ref 20–29)
Calcium: 9.1 mg/dL (ref 8.7–10.2)
Chloride: 102 mmol/L (ref 96–106)
Creatinine, Ser: 0.75 mg/dL (ref 0.57–1.00)
Globulin, Total: 3.2 g/dL (ref 1.5–4.5)
Glucose: 81 mg/dL (ref 70–99)
Potassium: 4.5 mmol/L (ref 3.5–5.2)
Sodium: 139 mmol/L (ref 134–144)
Total Protein: 7.7 g/dL (ref 6.0–8.5)
eGFR: 105 mL/min/{1.73_m2} (ref >59)

## 2021-10-23 LAB — LIPID PANEL
Chol/HDL Ratio: 3.9 ratio (ref 0.0–4.4)
Cholesterol, Total: 190 mg/dL (ref 100–199)
HDL: 49 mg/dL (ref >39)
LDL Chol Calc (NIH): 116 mg/dL — ABNORMAL HIGH (ref 0–99)
Triglycerides: 142 mg/dL (ref 0–149)
VLDL Cholesterol Cal: 25 mg/dL (ref 5–40)

## 2021-10-23 LAB — INSULIN, RANDOM: INSULIN: 18.9 u[IU]/mL (ref 2.6–24.9)

## 2021-10-23 LAB — VITAMIN D 25 HYDROXY (VIT D DEFICIENCY, FRACTURES): Vit D, 25-Hydroxy: 41.7 ng/mL (ref 30.0–100.0)

## 2021-10-23 LAB — TSH: TSH: 1.76 u[IU]/mL (ref 0.450–4.500)

## 2021-10-23 LAB — HEPATITIS C ANTIBODY: Hep C Virus Ab: <0.1 s/co ratio (ref 0.0–0.9)

## 2021-10-23 LAB — VITAMIN B12: Vitamin B-12: 782 pg/mL (ref 232–1245)

## 2021-10-28 ENCOUNTER — Ambulatory Visit: Payer: BLUE CROSS/BLUE SHIELD | Admitting: Nurse Practitioner

## 2021-11-12 NOTE — Telephone Encounter (Signed)
Yes just a lab visit for pregnancy test

## 2021-11-13 ENCOUNTER — Other Ambulatory Visit: Payer: Self-pay | Admitting: Nurse Practitioner

## 2021-11-13 ENCOUNTER — Other Ambulatory Visit (INDEPENDENT_AMBULATORY_CARE_PROVIDER_SITE_OTHER): Payer: BC Managed Care – PPO

## 2021-11-13 ENCOUNTER — Encounter: Payer: Self-pay | Admitting: Nurse Practitioner

## 2021-11-13 ENCOUNTER — Other Ambulatory Visit: Payer: Self-pay

## 2021-11-13 DIAGNOSIS — Z79899 Other long term (current) drug therapy: Secondary | ICD-10-CM | POA: Diagnosis not present

## 2021-11-13 DIAGNOSIS — Z6833 Body mass index (BMI) 33.0-33.9, adult: Secondary | ICD-10-CM

## 2021-11-13 DIAGNOSIS — E6609 Other obesity due to excess calories: Secondary | ICD-10-CM

## 2021-11-13 LAB — POCT URINE PREGNANCY: Preg Test, Ur: NEGATIVE

## 2021-11-13 MED ORDER — PHENTERMINE HCL 15 MG PO CAPS
15.0000 mg | ORAL_CAPSULE | ORAL | 1 refills | Status: DC
Start: 2021-11-13 — End: 2022-09-14

## 2021-11-13 NOTE — Progress Notes (Signed)
Negative urine pregnancy, will start phentermine she is to follow up in 2 months.

## 2021-11-17 DIAGNOSIS — E7800 Pure hypercholesterolemia, unspecified: Secondary | ICD-10-CM | POA: Insufficient documentation

## 2021-11-17 DIAGNOSIS — R748 Abnormal levels of other serum enzymes: Secondary | ICD-10-CM | POA: Insufficient documentation

## 2021-11-17 DIAGNOSIS — R635 Abnormal weight gain: Secondary | ICD-10-CM | POA: Insufficient documentation

## 2021-12-07 NOTE — L&D Delivery Note (Signed)
Delivery Note  40 y.o. [id number]  at [redacted]w[redacted]d who arrived via MAU in active labor. Noted to be 8cm dilated on arrival and feeling the urge to push transferred up to labor and delivery, and I stood by while Dr Garwin Brothers made her way in .   At 6:46 AM a viable female was delivered via Vaginal, Spontaneous (Presentation: Middle Occiput Anterior).  APGAR: 9, 9; weight 6 lb 13 oz (3090 g).   Cord clamped X2 and cut.   Placenta still in place and not delivered, Dr Garwin Brothers arrived to patient's room and continued with the delivery.  Please see her note for further details  Liliane Channel MD MPH OB Fellow, [place] for [place] 09/14/2022

## 2021-12-07 NOTE — L&D Delivery Note (Signed)
Called by MAU RN that pt presented in active labor 8 cm On Arrival pt was already transferred to L& D and was delivered by [facility] fellow Dr Trina Ao.  Baby was on mother's chest.   Delivery Note At 6:46 AM a viable and healthy female was delivered via Vaginal, Spontaneous (Presentation: Middle Occiput Anterior) by Dr Trina Ao.  APGAR: 9, 9; weight  pending.   Placenta status: Spontaneous, Intact  delivered by Marvene Staff, MD  Cord: 3 vessels with the following complications: short.  Cord pH: n/a  Anesthesia: Local Episiotomy: None Lacerations:  right Vaginal sulcus to right labia majus. Repaired by Dr Garwin Brothers Suture Repair: 3.0 chromic Est. Blood Loss (mL): 152  Mom to postpartum.  Baby to Couplet care / Skin to Skin.  Emma Reynolds 09/14/2022, 7:10 AM

## 2022-01-21 ENCOUNTER — Encounter: Payer: BC Managed Care – PPO | Admitting: Nurse Practitioner

## 2022-01-28 DIAGNOSIS — Z3200 Encounter for pregnancy test, result unknown: Secondary | ICD-10-CM | POA: Diagnosis not present

## 2022-02-25 DIAGNOSIS — O09521 Supervision of elderly multigravida, first trimester: Secondary | ICD-10-CM | POA: Diagnosis not present

## 2022-02-25 LAB — HEPATITIS C ANTIBODY: HCV Ab: NEGATIVE

## 2022-02-25 LAB — OB RESULTS CONSOLE RPR: RPR: NONREACTIVE

## 2022-02-25 LAB — OB RESULTS CONSOLE ANTIBODY SCREEN: Antibody Screen: NEGATIVE

## 2022-02-25 LAB — OB RESULTS CONSOLE HIV ANTIBODY (ROUTINE TESTING): HIV: NONREACTIVE

## 2022-02-25 LAB — OB RESULTS CONSOLE ABO/RH: RH Type: POSITIVE

## 2022-02-25 LAB — OB RESULTS CONSOLE HEPATITIS B SURFACE ANTIGEN: Hepatitis B Surface Ag: NEGATIVE

## 2022-02-25 LAB — OB RESULTS CONSOLE RUBELLA ANTIBODY, IGM: Rubella: IMMUNE

## 2022-03-26 DIAGNOSIS — Z3482 Encounter for supervision of other normal pregnancy, second trimester: Secondary | ICD-10-CM | POA: Diagnosis not present

## 2022-03-26 DIAGNOSIS — O3442 Maternal care for other abnormalities of cervix, second trimester: Secondary | ICD-10-CM | POA: Diagnosis not present

## 2022-03-26 DIAGNOSIS — O09522 Supervision of elderly multigravida, second trimester: Secondary | ICD-10-CM | POA: Diagnosis not present

## 2022-03-26 DIAGNOSIS — O09529 Supervision of elderly multigravida, unspecified trimester: Secondary | ICD-10-CM | POA: Diagnosis not present

## 2022-04-22 DIAGNOSIS — O09529 Supervision of elderly multigravida, unspecified trimester: Secondary | ICD-10-CM | POA: Diagnosis not present

## 2022-04-22 DIAGNOSIS — O3442 Maternal care for other abnormalities of cervix, second trimester: Secondary | ICD-10-CM | POA: Diagnosis not present

## 2022-04-22 DIAGNOSIS — O09522 Supervision of elderly multigravida, second trimester: Secondary | ICD-10-CM | POA: Diagnosis not present

## 2022-04-22 DIAGNOSIS — O4402 Placenta previa specified as without hemorrhage, second trimester: Secondary | ICD-10-CM | POA: Diagnosis not present

## 2022-06-20 ENCOUNTER — Other Ambulatory Visit: Payer: Self-pay

## 2022-06-20 ENCOUNTER — Inpatient Hospital Stay (HOSPITAL_COMMUNITY)
Admission: AD | Admit: 2022-06-20 | Discharge: 2022-06-21 | Discharge status: Against Medical Advice | Payer: BC Managed Care – PPO | Attending: Obstetrics and Gynecology | Admitting: Obstetrics and Gynecology

## 2022-06-20 ENCOUNTER — Encounter (HOSPITAL_COMMUNITY): Payer: Self-pay | Admitting: Obstetrics and Gynecology

## 2022-06-20 DIAGNOSIS — R079 Chest pain, unspecified: Secondary | ICD-10-CM | POA: Insufficient documentation

## 2022-06-20 DIAGNOSIS — Z3A26 26 weeks gestation of pregnancy: Secondary | ICD-10-CM | POA: Insufficient documentation

## 2022-06-20 DIAGNOSIS — R002 Palpitations: Secondary | ICD-10-CM | POA: Insufficient documentation

## 2022-06-20 DIAGNOSIS — R0602 Shortness of breath: Secondary | ICD-10-CM | POA: Diagnosis not present

## 2022-06-20 DIAGNOSIS — O26892 Other specified pregnancy related conditions, second trimester: Secondary | ICD-10-CM | POA: Diagnosis not present

## 2022-06-20 DIAGNOSIS — R0789 Other chest pain: Secondary | ICD-10-CM | POA: Diagnosis not present

## 2022-06-20 NOTE — ED Provider Triage Note (Signed)
Emergency Medicine Provider Triage Evaluation Note  Emma Reynolds , a 41 y.o. G4P2 female, currently 27w pregnant, was evaluated in triage.  Pt complains of chest tightness onset today. Symptoms have been gradually improving. Does have FHx of anxiety and panic attack during a prior pregnancy. No medications taken PTA.  Review of Systems  Positive: Chest pain Negative: As above  Physical Exam  BP 113/69 (BP Location: Right Arm)   Pulse 71   Temp 98.2 F (36.8 C) (Oral)   Resp 16   LMP 06/26/2021 (Approximate)   SpO2 99%  Gen:   Awake, no distress   Resp:  Normal effort  MSK:   Moves extremities without difficulty  Other:  Heart RRR  Medical Decision Making  Medically screening exam initiated at 11:33 PM.  Appropriate orders placed.  Emma Reynolds was informed that the remainder of the evaluation will be completed by another provider, this initial triage assessment does not replace that evaluation, and the importance of remaining in the ED until their evaluation is complete.  Chest pain in pregnancy; transfer from MAU and has been cleared from [facility] perspective. Pending cardiac labs.   Antony Madura, PA-C 06/20/22 2348

## 2022-06-20 NOTE — MAU Note (Signed)
Emma Reynolds is a 41 y.o. at Unknown here in MAU reporting: chest pain that started around 1830 "really tight in my chest" ate pizza and threw it up, can't keep water down either. Reporting sob and unable to take deep breaths. Denies VB or LOF. +FM. Pt states she is [redacted] weeks pregnant and pt of Dr. Cherly Hensen. EDD 09/23/2022  Onset of complaint: 1830 Pain score: 6  Vitals:   06/20/22 2122  BP: 124/69  Pulse: 80  Resp: 20  Temp: 98.5 F (36.9 C)  SpO2: 99%    FHT:140 Lab orders placed from triage:

## 2022-06-20 NOTE — MAU Note (Signed)
Dr. Cousins at bedside.

## 2022-06-20 NOTE — MAU Provider Note (Signed)
History     Chief Complaint  Patient presents with   Chest Pain  41 yo G5P2022 BF @ 26 3/[redacted] weeks gestation presents with c/o SOB and chest pain  started after  eating pizza.  Some palpitations.  (+) active fetus    OB History     Gravida  5   Para  2   Term  2   Preterm  0   AB  2   Living  2      SAB  1   IAB  1   Ectopic  0   Multiple  0   Live Births  2           Past Medical History:  Diagnosis Date   Anxiety    Fatty liver    Gallstones    Gilbert's syndrome 05/29/2018   Miscarriage     Past Surgical History:  Procedure Laterality Date   BUNIONECTOMY     CHOLECYSTECTOMY      Family History  Problem Relation Age of Onset   Colon cancer Mother        Late 80s   Heart disease Mother    Kidney failure Mother    Hypertension Father    COPD Maternal Grandmother    Arthritis Maternal Grandmother    Stroke Maternal Grandfather    Heart disease Maternal Grandfather    Prostate cancer Maternal Grandfather    Heart attack Paternal Grandmother    Dementia Paternal Grandfather     Social History   Tobacco Use   Smoking status: Never   Smokeless tobacco: Never  Vaping Use   Vaping Use: Never used  Substance Use Topics   Alcohol use: Yes    Comment: occasional   Drug use: Never    Allergies: No Known Allergies  Medications Prior to Admission  Medication Sig Dispense Refill Last Dose   Prenatal Vit-Fe Fumarate-FA (PRENATAL VITAMIN PO) Take 1 tablet by mouth daily.   06/19/2022   Cholecalciferol (DIALYVITE VITAMIN D 5000 PO) Take by mouth.      phentermine 15 MG capsule Take 1 capsule (15 mg total) by mouth every morning. 30 capsule 1      Physical Exam   Blood pressure 115/74, pulse 73, temperature 98.5 F (36.9 C), temperature source Oral, resp. rate 20, last menstrual period 06/26/2021, SpO2 99 %.  General appearance: alert, cooperative, and no distress Lungs: clear to auscultation bilaterally Heart: regular rate and rhythm,  S1, S2 normal, no murmur, click, rub or gallop Abdomen:  gravid soft Extremities: extremities normal, atraumatic, no cyanosis or edema and no edema, redness or tenderness in the calves or thighs Tracing: baseline 145 NR c/w gestation age No ctx  IMP: SOB/CP IUP @ 26 3/7 days  Obstetrically cleared. Transfer to main ED for further evaluation MDM   Serita Kyle, MD 10:16 PM 06/20/2022

## 2022-06-20 NOTE — MAU Note (Signed)
Pt transferred to ER via wheelchair in stable condition following MSE per DrCousin's

## 2022-06-20 NOTE — MAU Note (Addendum)
Pt states she is able to take a deep breath-lower sternal/epigastric tightness resolving-Pt denied radiation of pain to L side or jaw. She did have 2 episodes of vomiting after eating pizza-pain started after-pt remains alert and oriented - speaking without pausing for breath. RR17-20. Sats remain 97-100%. Cleared by Dr.Cousins to transfer to ER for further evaluation. Report called to ER Charge RN. Dr.Cousins spoke with ER MD to accept transfer. Pt in stable condition for transfer.

## 2022-06-20 NOTE — MAU Note (Addendum)
Emma Reynolds, CNM in triage.   Pt states pain is in her upper abdomen rather than in her chest.

## 2022-06-20 NOTE — ED Triage Notes (Signed)
Pt is [redacted] weeks pregnant, began having CP, SOB and vomited twice. Pt states that she had panic attacks with prior pregnancy

## 2022-06-21 DIAGNOSIS — O26892 Other specified pregnancy related conditions, second trimester: Secondary | ICD-10-CM | POA: Diagnosis not present

## 2022-06-21 LAB — BASIC METABOLIC PANEL
Anion gap: 7 (ref 5–15)
BUN: <5 mg/dL — ABNORMAL LOW (ref 6–20)
CO2: 21 mmol/L — ABNORMAL LOW (ref 22–32)
Calcium: 8.6 mg/dL — ABNORMAL LOW (ref 8.9–10.3)
Chloride: 109 mmol/L (ref 98–111)
Creatinine, Ser: 0.58 mg/dL (ref 0.44–1.00)
GFR, Estimated: >60 mL/min (ref >60)
Glucose, Bld: 84 mg/dL (ref 70–99)
Potassium: 3.2 mmol/L — ABNORMAL LOW (ref 3.5–5.1)
Sodium: 137 mmol/L (ref 135–145)

## 2022-06-21 LAB — CBC WITH DIFFERENTIAL/PLATELET
Abs Immature Granulocytes: 0.17 10*3/uL — ABNORMAL HIGH (ref 0.00–0.07)
Basophils Absolute: 0 10*3/uL (ref 0.0–0.1)
Basophils Relative: 0 %
Eosinophils Absolute: 0.2 10*3/uL (ref 0.0–0.5)
Eosinophils Relative: 2 %
HCT: 32.7 % — ABNORMAL LOW (ref 36.0–46.0)
Hemoglobin: 11.2 g/dL — ABNORMAL LOW (ref 12.0–15.0)
Immature Granulocytes: 1 %
Lymphocytes Relative: 22 %
Lymphs Abs: 2.9 10*3/uL (ref 0.7–4.0)
MCH: 32.6 pg (ref 26.0–34.0)
MCHC: 34.3 g/dL (ref 30.0–36.0)
MCV: 95.1 fL (ref 80.0–100.0)
Monocytes Absolute: 1 10*3/uL (ref 0.1–1.0)
Monocytes Relative: 7 %
Neutro Abs: 8.6 10*3/uL — ABNORMAL HIGH (ref 1.7–7.7)
Neutrophils Relative %: 68 %
Platelets: 304 10*3/uL (ref 150–400)
RBC: 3.44 MIL/uL — ABNORMAL LOW (ref 3.87–5.11)
RDW: 13.3 % (ref 11.5–15.5)
WBC: 12.8 10*3/uL — ABNORMAL HIGH (ref 4.0–10.5)
nRBC: 0 % (ref 0.0–0.2)

## 2022-06-21 LAB — TROPONIN I (HIGH SENSITIVITY)
Troponin I (High Sensitivity): 4 ng/L (ref <18)
Troponin I (High Sensitivity): 5 ng/L (ref <18)

## 2022-06-21 NOTE — ED Notes (Signed)
PT opted to leave per Patient Access

## 2022-07-22 DIAGNOSIS — O3663X0 Maternal care for excessive fetal growth, third trimester, not applicable or unspecified: Secondary | ICD-10-CM | POA: Diagnosis not present

## 2022-07-22 DIAGNOSIS — O3443 Maternal care for other abnormalities of cervix, third trimester: Secondary | ICD-10-CM | POA: Diagnosis not present

## 2022-07-22 DIAGNOSIS — O09523 Supervision of elderly multigravida, third trimester: Secondary | ICD-10-CM | POA: Diagnosis not present

## 2022-08-05 DIAGNOSIS — O09523 Supervision of elderly multigravida, third trimester: Secondary | ICD-10-CM | POA: Diagnosis not present

## 2022-08-05 DIAGNOSIS — O26899 Other specified pregnancy related conditions, unspecified trimester: Secondary | ICD-10-CM | POA: Diagnosis not present

## 2022-08-05 DIAGNOSIS — O3443 Maternal care for other abnormalities of cervix, third trimester: Secondary | ICD-10-CM | POA: Diagnosis not present

## 2022-08-05 DIAGNOSIS — O3663X0 Maternal care for excessive fetal growth, third trimester, not applicable or unspecified: Secondary | ICD-10-CM | POA: Diagnosis not present

## 2022-08-12 DIAGNOSIS — Z9889 Other specified postprocedural states: Secondary | ICD-10-CM | POA: Diagnosis not present

## 2022-08-12 DIAGNOSIS — O4443 Low lying placenta NOS or without hemorrhage, third trimester: Secondary | ICD-10-CM | POA: Diagnosis not present

## 2022-08-12 DIAGNOSIS — O09523 Supervision of elderly multigravida, third trimester: Secondary | ICD-10-CM | POA: Diagnosis not present

## 2022-08-14 DIAGNOSIS — R609 Edema, unspecified: Secondary | ICD-10-CM | POA: Diagnosis not present

## 2022-08-17 DIAGNOSIS — M5489 Other dorsalgia: Secondary | ICD-10-CM | POA: Diagnosis not present

## 2022-08-26 DIAGNOSIS — O4443 Low lying placenta NOS or without hemorrhage, third trimester: Secondary | ICD-10-CM | POA: Diagnosis not present

## 2022-08-26 DIAGNOSIS — Z3685 Encounter for antenatal screening for Streptococcus B: Secondary | ICD-10-CM | POA: Diagnosis not present

## 2022-08-26 DIAGNOSIS — O3663X0 Maternal care for excessive fetal growth, third trimester, not applicable or unspecified: Secondary | ICD-10-CM | POA: Diagnosis not present

## 2022-08-26 DIAGNOSIS — O3443 Maternal care for other abnormalities of cervix, third trimester: Secondary | ICD-10-CM | POA: Diagnosis not present

## 2022-08-26 DIAGNOSIS — O09523 Supervision of elderly multigravida, third trimester: Secondary | ICD-10-CM | POA: Diagnosis not present

## 2022-08-26 LAB — OB RESULTS CONSOLE GBS: GBS: NEGATIVE

## 2022-09-02 DIAGNOSIS — O3443 Maternal care for other abnormalities of cervix, third trimester: Secondary | ICD-10-CM | POA: Diagnosis not present

## 2022-09-02 DIAGNOSIS — O09529 Supervision of elderly multigravida, unspecified trimester: Secondary | ICD-10-CM | POA: Diagnosis not present

## 2022-09-02 DIAGNOSIS — O09523 Supervision of elderly multigravida, third trimester: Secondary | ICD-10-CM | POA: Diagnosis not present

## 2022-09-02 DIAGNOSIS — O3663X0 Maternal care for excessive fetal growth, third trimester, not applicable or unspecified: Secondary | ICD-10-CM | POA: Diagnosis not present

## 2022-09-02 IMAGING — DX DG ANKLE COMPLETE 3+V*L*
3 series · 3 of 3 positions shown · non-contrast
Comparison: None.

CLINICAL DATA: Pain and swelling after fall

EXAM:
LEFT ANKLE COMPLETE - 3+ VIEW

[ankle ap]
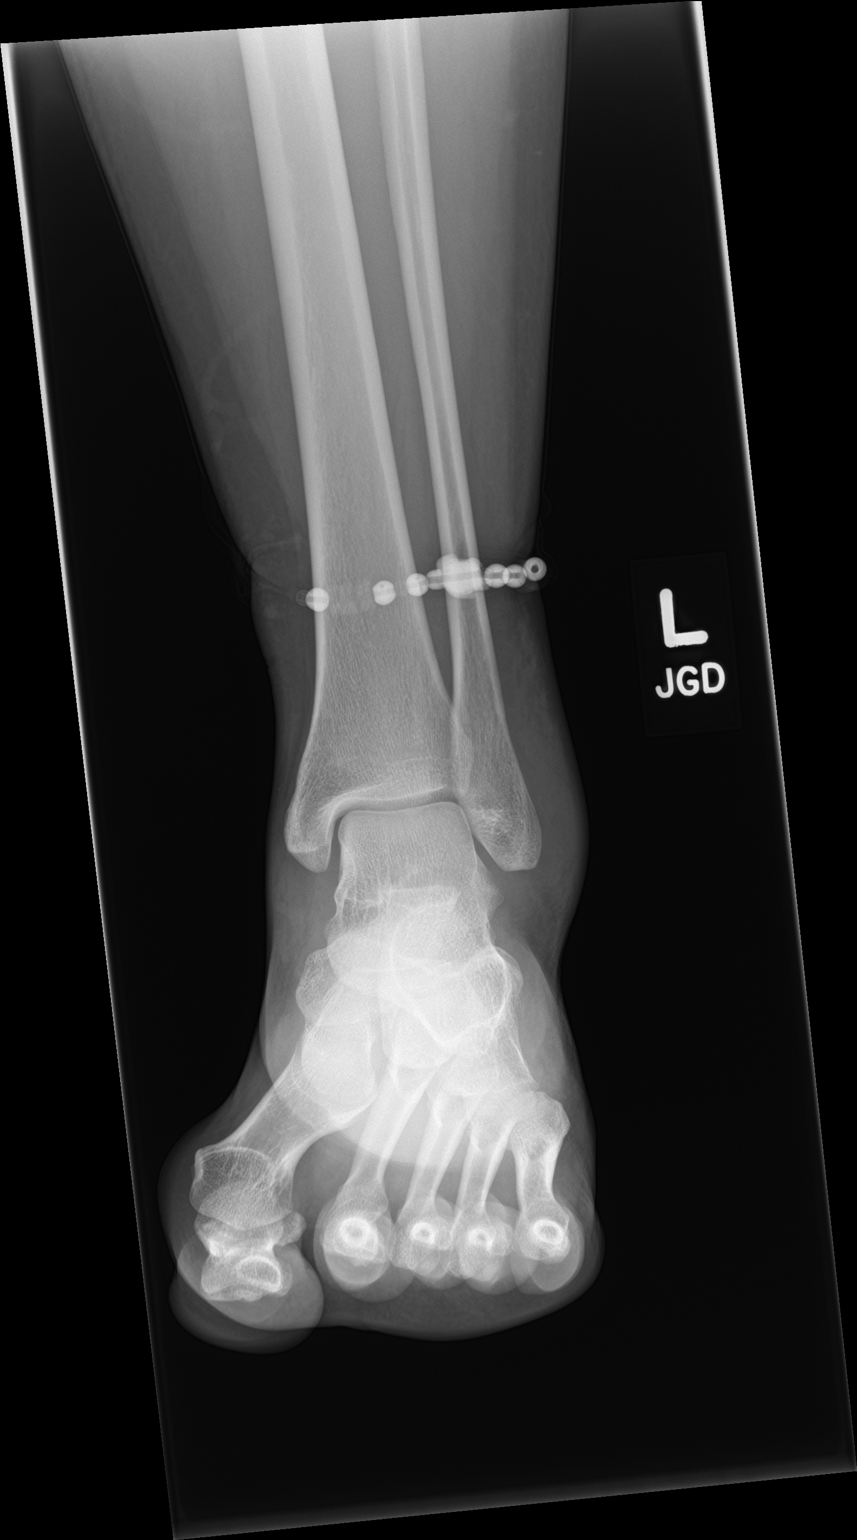

[ankle obl]
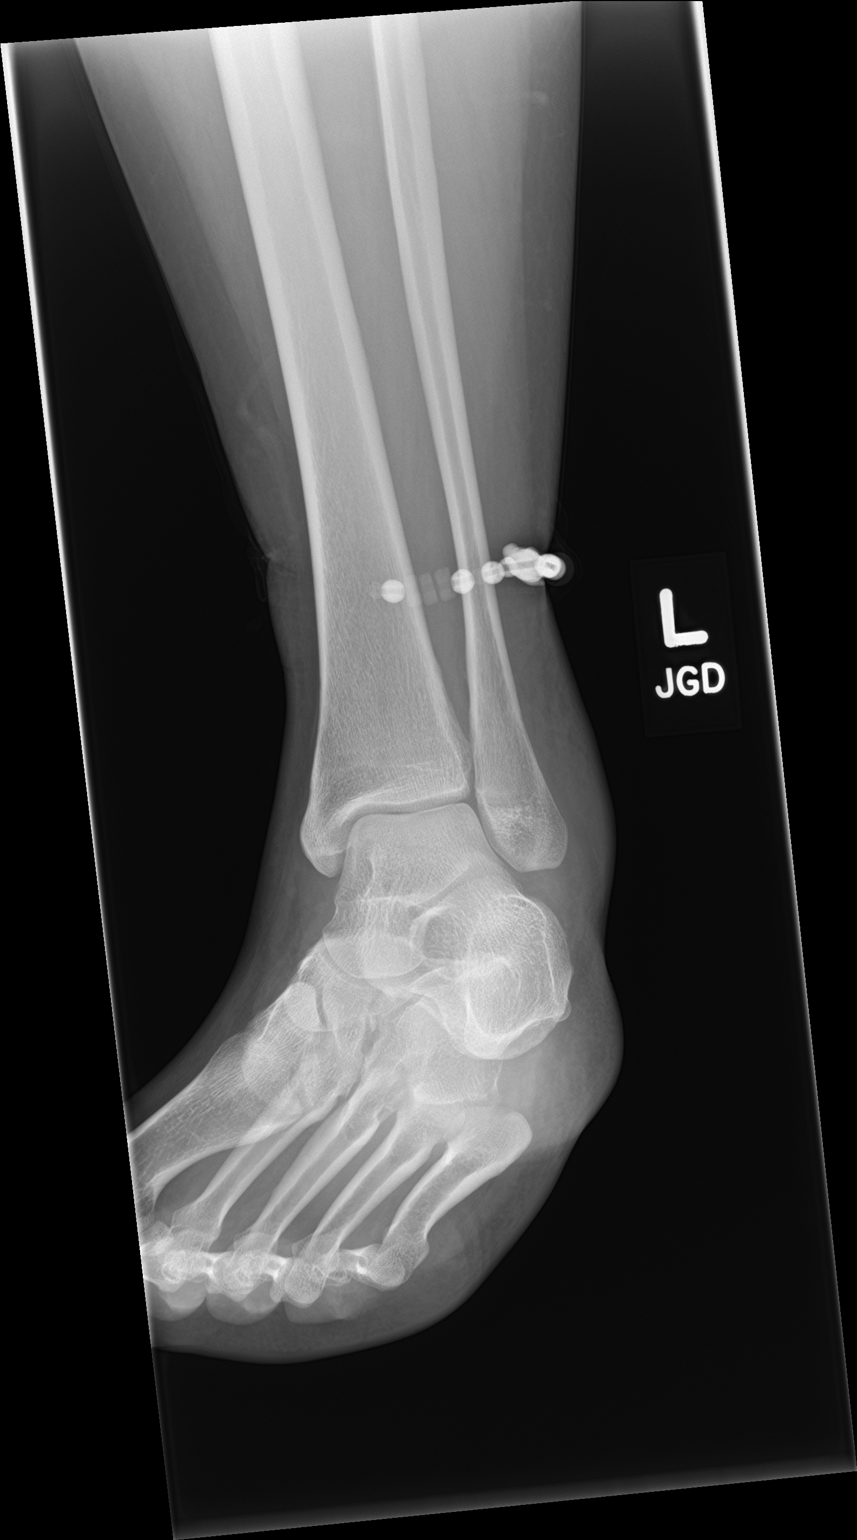

[ankle lat]
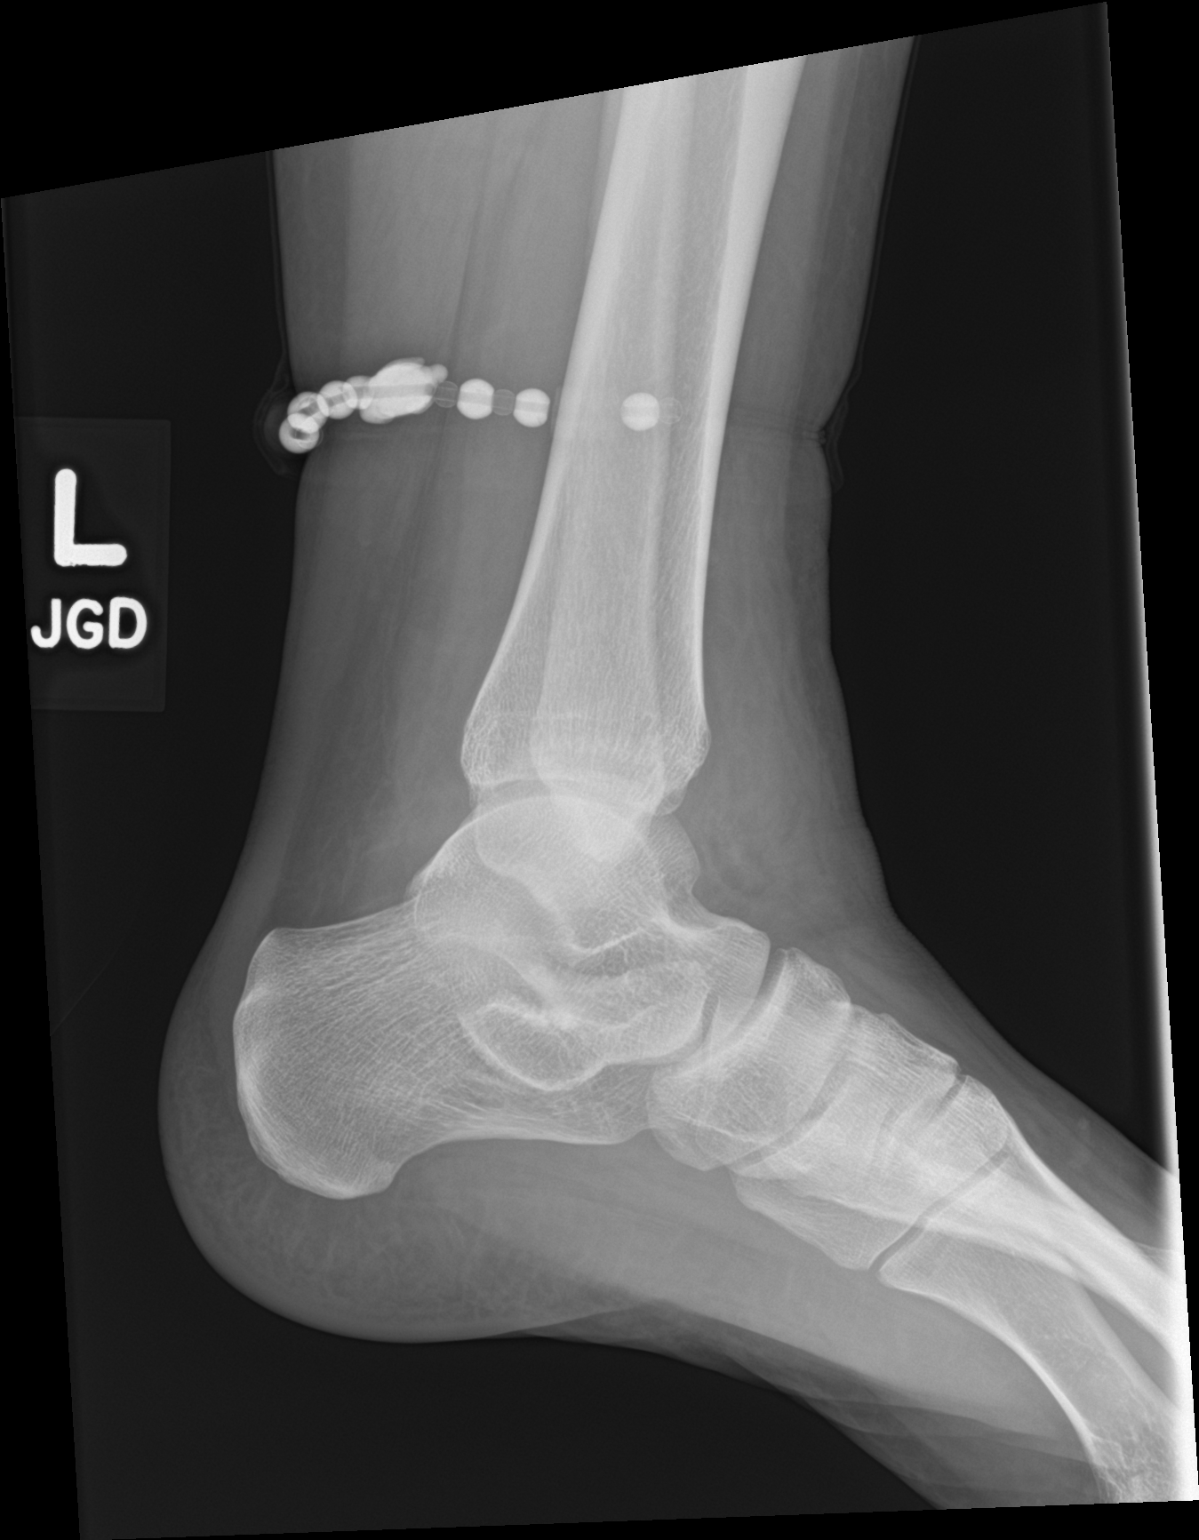

[3 of 3 positions shown; findings below may reference images not displayed]

FINDINGS: Soft tissue swelling at the ankle particularly at the lateral
malleolus. Alignment is anatomic. There is no acute fracture. Joint
spaces are preserved.
IMPRESSION: No acute fracture.

## 2022-09-09 DIAGNOSIS — O3663X0 Maternal care for excessive fetal growth, third trimester, not applicable or unspecified: Secondary | ICD-10-CM | POA: Diagnosis not present

## 2022-09-09 DIAGNOSIS — O3443 Maternal care for other abnormalities of cervix, third trimester: Secondary | ICD-10-CM | POA: Diagnosis not present

## 2022-09-09 DIAGNOSIS — O09529 Supervision of elderly multigravida, unspecified trimester: Secondary | ICD-10-CM | POA: Diagnosis not present

## 2022-09-09 DIAGNOSIS — O09523 Supervision of elderly multigravida, third trimester: Secondary | ICD-10-CM | POA: Diagnosis not present

## 2022-09-14 ENCOUNTER — Other Ambulatory Visit: Payer: Self-pay

## 2022-09-14 ENCOUNTER — Inpatient Hospital Stay (HOSPITAL_COMMUNITY)
Admission: AD | Admit: 2022-09-14 | Discharge: 2022-09-16 | DRG: 807 | Disposition: A | Discharge status: Home / Self Care | Payer: BC Managed Care – PPO | Attending: Obstetrics and Gynecology | Admitting: Obstetrics and Gynecology

## 2022-09-14 ENCOUNTER — Encounter (HOSPITAL_COMMUNITY): Payer: Self-pay | Admitting: Obstetrics and Gynecology

## 2022-09-14 DIAGNOSIS — Z23 Encounter for immunization: Secondary | ICD-10-CM | POA: Diagnosis not present

## 2022-09-14 DIAGNOSIS — Z3A38 38 weeks gestation of pregnancy: Secondary | ICD-10-CM

## 2022-09-14 DIAGNOSIS — O26893 Other specified pregnancy related conditions, third trimester: Secondary | ICD-10-CM | POA: Diagnosis not present

## 2022-09-14 LAB — CBC
HCT: 34.5 % — ABNORMAL LOW (ref 36.0–46.0)
Hemoglobin: 11.4 g/dL — ABNORMAL LOW (ref 12.0–15.0)
MCH: 31.1 pg (ref 26.0–34.0)
MCHC: 33 g/dL (ref 30.0–36.0)
MCV: 94 fL (ref 80.0–100.0)
Platelets: 301 10*3/uL (ref 150–400)
RBC: 3.67 MIL/uL — ABNORMAL LOW (ref 3.87–5.11)
RDW: 14.5 % (ref 11.5–15.5)
WBC: 10.7 10*3/uL — ABNORMAL HIGH (ref 4.0–10.5)
nRBC: 0 % (ref 0.0–0.2)

## 2022-09-14 LAB — TYPE AND SCREEN
ABO/RH(D): O POS
Antibody Screen: NEGATIVE

## 2022-09-14 LAB — GLUCOSE, RANDOM: Glucose, Bld: 106 mg/dL — ABNORMAL HIGH (ref 70–99)

## 2022-09-14 LAB — RPR: RPR Ser Ql: NONREACTIVE

## 2022-09-14 MED ORDER — DIBUCAINE (PERIANAL) 1 % EX OINT: 1.0000 | TOPICAL_OINTMENT | CUTANEOUS | Status: DC | PRN

## 2022-09-14 MED ORDER — LIDOCAINE HCL (PF) 1 % IJ SOLN
INTRAMUSCULAR | Status: AC
  Administered 2022-09-14: 30 mL
  Filled 2022-09-14: qty 30

## 2022-09-14 MED ORDER — LACTATED RINGERS IV SOLN: 500.0000 mL | INTRAVENOUS | Status: DC | PRN

## 2022-09-14 MED ORDER — OXYCODONE HCL 5 MG PO TABS
10.0000 mg | ORAL_TABLET | ORAL | Status: DC | PRN
  Administered 2022-09-14: 10 mg via ORAL
  Filled 2022-09-14: qty 2

## 2022-09-14 MED ORDER — OXYTOCIN-SODIUM CHLORIDE 30-0.9 UT/500ML-% IV SOLN: 2.5000 [IU]/h | INTRAVENOUS | Status: DC

## 2022-09-14 MED ORDER — OXYCODONE-ACETAMINOPHEN 5-325 MG PO TABS: 1.0000 | ORAL_TABLET | ORAL | Status: DC | PRN

## 2022-09-14 MED ORDER — IBUPROFEN 600 MG PO TABS
600.0000 mg | ORAL_TABLET | Freq: Four times a day (QID) | ORAL | Status: DC
  Administered 2022-09-14 – 2022-09-16 (×9): 600 mg via ORAL
  Filled 2022-09-14 (×10): qty 1

## 2022-09-14 MED ORDER — ZOLPIDEM TARTRATE 5 MG PO TABS: 5.0000 mg | ORAL_TABLET | Freq: Every evening | ORAL | Status: DC | PRN

## 2022-09-14 MED ORDER — OXYCODONE HCL 5 MG PO TABS: 5.0000 mg | ORAL_TABLET | ORAL | Status: DC | PRN

## 2022-09-14 MED ORDER — OXYTOCIN BOLUS FROM INFUSION: 333.0000 mL | Freq: Once | INTRAVENOUS | Status: DC

## 2022-09-14 MED ORDER — PRENATAL MULTIVITAMIN CH
1.0000 | ORAL_TABLET | Freq: Every day | ORAL | Status: DC
  Administered 2022-09-14 – 2022-09-16 (×3): 1 via ORAL
  Filled 2022-09-14 (×3): qty 1

## 2022-09-14 MED ORDER — ACETAMINOPHEN 325 MG PO TABS: 650.0000 mg | ORAL_TABLET | ORAL | Status: DC | PRN

## 2022-09-14 MED ORDER — OXYCODONE-ACETAMINOPHEN 5-325 MG PO TABS: 2.0000 | ORAL_TABLET | ORAL | Status: DC | PRN

## 2022-09-14 MED ORDER — DIPHENHYDRAMINE HCL 25 MG PO CAPS: 25.0000 mg | ORAL_CAPSULE | Freq: Four times a day (QID) | ORAL | Status: DC | PRN

## 2022-09-14 MED ORDER — ONDANSETRON HCL 4 MG/2ML IJ SOLN: 4.0000 mg | Freq: Four times a day (QID) | INTRAMUSCULAR | Status: DC | PRN

## 2022-09-14 MED ORDER — ONDANSETRON HCL 4 MG PO TABS: 4.0000 mg | ORAL_TABLET | ORAL | Status: DC | PRN

## 2022-09-14 MED ORDER — WITCH HAZEL-GLYCERIN EX PADS: 1.0000 | MEDICATED_PAD | CUTANEOUS | Status: DC | PRN

## 2022-09-14 MED ORDER — SIMETHICONE 80 MG PO CHEW: 80.0000 mg | CHEWABLE_TABLET | ORAL | Status: DC | PRN

## 2022-09-14 MED ORDER — SOD CITRATE-CITRIC ACID 500-334 MG/5ML PO SOLN: 30.0000 mL | ORAL | Status: DC | PRN

## 2022-09-14 MED ORDER — LACTATED RINGERS IV SOLN: INTRAVENOUS | Status: DC

## 2022-09-14 MED ORDER — SENNOSIDES-DOCUSATE SODIUM 8.6-50 MG PO TABS
2.0000 | ORAL_TABLET | Freq: Every day | ORAL | Status: DC
  Administered 2022-09-15 – 2022-09-16 (×2): 2 via ORAL
  Filled 2022-09-14 (×2): qty 2

## 2022-09-14 MED ORDER — LIDOCAINE HCL (PF) 1 % IJ SOLN: 30.0000 mL | INTRAMUSCULAR | Status: DC | PRN

## 2022-09-14 MED ORDER — BENZOCAINE-MENTHOL 20-0.5 % EX AERO
1.0000 | INHALATION_SPRAY | CUTANEOUS | Status: DC | PRN
  Filled 2022-09-14: qty 56

## 2022-09-14 MED ORDER — OXYTOCIN 10 UNIT/ML IJ SOLN: 10.0000 [IU] | Freq: Once | INTRAMUSCULAR | Status: DC

## 2022-09-14 MED ORDER — OXYTOCIN 10 UNIT/ML IJ SOLN
INTRAMUSCULAR | Status: AC
  Administered 2022-09-14: 10 [IU]
  Filled 2022-09-14: qty 1

## 2022-09-14 MED ORDER — COCONUT OIL OIL: 1.0000 | TOPICAL_OIL | Status: DC | PRN

## 2022-09-14 MED ORDER — FERROUS SULFATE 325 (65 FE) MG PO TABS
325.0000 mg | ORAL_TABLET | Freq: Two times a day (BID) | ORAL | Status: DC
  Administered 2022-09-14 – 2022-09-16 (×4): 325 mg via ORAL
  Filled 2022-09-14 (×4): qty 1

## 2022-09-14 MED ORDER — ACETAMINOPHEN 325 MG PO TABS
650.0000 mg | ORAL_TABLET | ORAL | Status: DC | PRN
  Administered 2022-09-15: 650 mg via ORAL
  Filled 2022-09-14: qty 2

## 2022-09-14 MED ORDER — ONDANSETRON HCL 4 MG/2ML IJ SOLN: 4.0000 mg | INTRAMUSCULAR | Status: DC | PRN

## 2022-09-14 NOTE — Lactation Note (Signed)
This note was copied from a baby's chart. Lactation Consultation Note  Patient Name: Emma Reynolds Today's Date: 09/14/2022   Age:39 hours Birth Parent has visitors and will call [place] when ready for [facility] consult, [facility] written name on White Board in the room. Maternal Data    Feeding    LATCH Score                    Lactation Tools Discussed/Used    Interventions    Discharge    Consult Status      [name] 09/14/2022, 4:47 PM

## 2022-09-14 NOTE — MAU Note (Signed)
Pt pulled straight back to MAU exam room from lobby. Reports ctx since yesterday every 10 minutes, but SROM at 0600 and ctx have been every 4-5 minutes since. +FM   SVE: 8 / 100 / -2   S. Garwin Brothers, MD notified and en route.   FHT: 784  [id number] pt transported to L&D with P. Philbert Riser, RN and Robyne Askew, NP.

## 2022-09-14 NOTE — H&P (Signed)
Emma Reynolds is a 41 y.o. female presenting @ 41 5/7 d in active labor. Per pt, water broke at home. (+) ctx from late yesterday. OB History     Gravida  5   Para  2   Term  2   Preterm  0   AB  2   Living  2      SAB  1   IAB  1   Ectopic  0   Multiple  0   Live Births  2          Past Medical History:  Diagnosis Date   Anxiety    Fatty liver    Gallstones    Gilbert's syndrome 05/29/2018   Miscarriage    Past Surgical History:  Procedure Laterality Date   BUNIONECTOMY     CHOLECYSTECTOMY     Family History: family history includes Arthritis in her maternal grandmother; COPD in her maternal grandmother; Colon cancer in her mother; Dementia in her paternal grandfather; Heart attack in her paternal grandmother; Heart disease in her maternal grandfather and mother; Hypertension in her father; Kidney failure in her mother; Prostate cancer in her maternal grandfather; Stroke in her maternal grandfather. Social History:  reports that she has never smoked. She has never used smokeless tobacco. She reports current alcohol use. She reports that she does not use drugs.     Maternal Diabetes: No Genetic Screening: Normal Maternal Ultrasounds/Referrals: Normal Fetal Ultrasounds or other Referrals:  None Maternal Substance Abuse:  No Significant Maternal Medications:  None Significant Maternal Lab Results:  Group B Strep negative Number of Prenatal Visits:greater than 3 verified prenatal visits Other Comments:   fetal macrosomia  Review of Systems  All other systems reviewed and are negative.  Maternal Medical History:  Reason for admission: Rupture of membranes and contractions.   Prenatal complications: no prenatal complications Prenatal Complications - Diabetes: none.   Dilation: 10 Effacement (%): 100 Station: Crowning Exam by:: [employer], RN Blood pressure 108/71, pulse 67, weight 104.6 kg, last menstrual period 06/26/2021. Exam Physical  Exam Constitutional:      Appearance: Normal appearance.  Eyes:     Extraocular Movements: Extraocular movements intact.  Cardiovascular:     Rate and Rhythm: Regular rhythm.     Heart sounds: Normal heart sounds.  Pulmonary:     Breath sounds: Normal breath sounds.  Genitourinary:    Comments: See delivery note Musculoskeletal:        General: No swelling.  Skin:    General: Skin is warm.  Neurological:     Mental Status: She is alert and oriented to person, place, and time.  Psychiatric:        Mood and Affect: Mood normal.        Behavior: Behavior normal.     Prenatal labs: ABO, Rh:  O positive Antibody:  neg Rubella:  Immune RPR:   NR HBsAg:   neg HIV:   neg GBS:   negative  Assessment/Plan: Labor Term AMA P) admit routine labs. See delivery note   Brilyn Tuller A Liahna Brickner 09/14/2022, 7:17 AM

## 2022-09-15 LAB — CBC
HCT: 30 % — ABNORMAL LOW (ref 36.0–46.0)
Hemoglobin: 10.5 g/dL — ABNORMAL LOW (ref 12.0–15.0)
MCH: 32.3 pg (ref 26.0–34.0)
MCHC: 35 g/dL (ref 30.0–36.0)
MCV: 92.3 fL (ref 80.0–100.0)
Platelets: 262 10*3/uL (ref 150–400)
RBC: 3.25 MIL/uL — ABNORMAL LOW (ref 3.87–5.11)
RDW: 14.6 % (ref 11.5–15.5)
WBC: 12.3 10*3/uL — ABNORMAL HIGH (ref 4.0–10.5)
nRBC: 0 % (ref 0.0–0.2)

## 2022-09-15 NOTE — Progress Notes (Signed)
CSW received consult for hx of Anxiety.  CSW met with MOB to offer support and complete assessment. CSW introduced self and explained reason for consult. MOB was welcoming, pleasant, talkative, open, and remained engaged during assessment. CSW and MOB discussed MOB's mental health history. MOB reported that she was diagnosed with anxiety in 2007 during college. MOB reported that she is not currently taking any medication nor participating in therapy to treat anxiety. MOB denied needing any therapy resources. 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 CSW inquired about MOB's coping skills, MOB reported that she works out, takes breaks, and has solo dates. MOB shared that her coping skills are helpful. CSW positively affirmed MOB's healthy coping skills and participation in self care. MOB denied any additional mental health history. MOB denied any history of postpartum depression. CSW inquired about how MOB was feeling emotionally since giving birth, MOB reported that she was feeling good. MOB presented calm and did not demonstrate any acute mental health signs/symptoms. CSW assessed for safety, MOB denied SI, HI, and domestic violence. CSW inquired about MOB's support system, MOB reported that her husband, sister, and brother are supports.   CSW provided education regarding the baby blues period vs. perinatal mood disorders, discussed treatment and gave resources for mental health follow up if concerns arise.  CSW recommends self-evaluation during the postpartum time period using the New Mom Checklist from Postpartum Progress and encouraged MOB to contact a medical professional if symptoms are noted at any time.    CSW provided review of Sudden Infant Death Syndrome (SIDS) precautions. MOB verbalized understanding and reported having all items needed to care for infant including a car seat, basinet, and crib. MOB shared that infant has a nursery.   CSW  identifies no further need for intervention and no barriers to discharge at this time.  Abundio Miu, [place] Worker [facility] Cell#: [phone]

## 2022-09-15 NOTE — Lactation Note (Signed)
This note was copied from a baby's chart. Lactation Consultation Note  Patient Name: Emma Reynolds Today's Date: 09/15/2022   Age:39 hours Per RN Lorrin Goodell),  Birth Parent declined [place] would like to be seen by LC in morning. Maternal Data    Feeding    LATCH Score                    Lactation Tools Discussed/Used    Interventions    Discharge    Consult Status      [name] 09/15/2022, 12:10 AM

## 2022-09-15 NOTE — Progress Notes (Signed)
PPD1 SVD:   S:  Pt reports feeling well. Declines early discharge/ Tolerating po/ Voiding without problems/ No n/v/ Bleeding is light/ Pain controlled withprescription NSAID's including ibuprofen (Motrin)  Newborn info live female BRF   O:  A & O x 3 / VS: Blood pressure 114/68, pulse 66, temperature 98 F (36.7 C), temperature source Oral, resp. rate 16, height 5\' 6"  (1.676 m), weight 104.3 kg, last menstrual period 06/26/2021, SpO2 100 %, unknown if currently breastfeeding.  LABS:  Results for orders placed or performed during the hospital encounter of 09/14/22 (from the past 24 hour(s))  CBC     Status: Abnormal   Collection Time: 09/15/22  5:16 AM  Result Value Ref Range   WBC 12.3 (H) 4.0 - 10.5 K/uL   RBC 3.25 (L) 3.87 - 5.11 MIL/uL   Hemoglobin 10.5 (L) 12.0 - 15.0 g/dL   HCT 30.0 (L) 36.0 - 46.0 %   MCV 92.3 80.0 - 100.0 fL   MCH 32.3 26.0 - 34.0 pg   MCHC 35.0 30.0 - 36.0 g/dL   RDW 14.6 11.5 - 15.5 %   Platelets 262 150 - 400 K/uL   nRBC 0.0 0.0 - 0.2 %    I&O: I/O last 3 completed shifts: In: -  Out: 227 [Blood:227]   No intake/output data recorded.  Lungs: chest clear, no wheezing, rales, normal symmetric air entry  Heart: regular rate and rhythm, S1, S2 normal, no murmur, click, rub or gallop  Abdomen: uterus firm at 42fb above umb  Perineum: is normal  Lochia: mod  Extremities:no redness or tenderness in the calves or thighs, no edema    A/P: PPD # 1/ [id number]  Doing well  Continue routine post partum orders  Anticipate d/c in am

## 2022-09-15 NOTE — Lactation Note (Signed)
This note was copied from a baby's chart. Lactation Consultation Note  Patient Name: Emma Reynolds Today's Date: 09/15/2022 Reason for consult: Initial assessment;Early term 37-38.6wks Age:39 hours Mom stated baby is cluster feeding. Dad was holding the baby so mom could get a break. Mom stated the baby falls asleep on the breast and when he is put down he cries. He last fed 40 minutes. Discussed feeding positions, support, body alignment, props, safety while feeding, cramping, mature milk coming in and newborn feeding habits and behavior.  Mom didn't have any issues w/milk supply w/her 2 children ages 44 and 69 yrs old. Mom would like for Lactation to come again tomorrow before she is discharged home.  Maternal Data    Feeding    LATCH Score                    Lactation Tools Discussed/Used    Interventions Interventions: Breast feeding basics reviewed  Discharge    Consult Status Consult Status: Follow-up Date: 09/16/22 Follow-up type: In-patient    Theodoro Kalata 09/15/2022, 10:24 PM

## 2022-09-16 MED ORDER — INFLUENZA VAC SPLIT QUAD 0.5 ML IM SUSY
0.5000 mL | PREFILLED_SYRINGE | Freq: Once | INTRAMUSCULAR | Status: AC
  Administered 2022-09-16: 0.5 mL via INTRAMUSCULAR
  Filled 2022-09-16: qty 0.5

## 2022-09-16 MED ORDER — IBUPROFEN 600 MG PO TABS: 600.0000 mg | ORAL_TABLET | Freq: Four times a day (QID) | ORAL | 11 refills | Status: DC | PRN

## 2022-09-16 NOTE — Lactation Note (Signed)
This note was copied from a baby's chart. Lactation Consultation Note  Patient Name: Emma Reynolds HYIFO'Y Date: 09/16/2022 Age - 8 hours old. Serum Bili this am - 10.7.  Reason for consult: Follow-up assessment;Early term 37-38.6wks;Infant weight loss (9 % weight loss, per mom the baby recently fed. LC updated and reviewed the doc flow sheets with mom and confirmed all the voids and stools were charted. LC encouraged mom to call with feeding cues for latch assessment. Elevated Bilirubin,) Mom reports she is hearing swallows with the feedings and STS.  Maternal Data    Feeding Mother's Current Feeding Choice: Breast Milk  LATCH Score - baby recently fed, per mom swallows and encouraged to call.    Lactation Tools Discussed/Used    Interventions Interventions: Breast feeding basics reviewed;Education  Discharge Pump: DEBP;Personal;Hands Free (per mom a DEBP and a Hand free) WIC Program: No  Consult Status Consult Status: Follow-up Date: 09/16/22 Follow-up type: In-patient    [place] 09/16/2022, 9:03 AM

## 2022-09-16 NOTE — Lactation Note (Addendum)
This note was copied from a baby's chart. Lactation Consultation Note  Patient Name: Emma Reynolds Today's Date: 09/16/2022 2nd [place] visit this am, 54 hours old  Reason for consult: Follow-up assessment;Early term 37-38.6wks;Infant weight loss;Breastfeeding assistance (9 % weight loss/ milk coming in [place] assisted to latch and worked on reverse pressure, positioning and depth at the breast. baby fed 15 mins , multiple swallows.) per mom the latch felt so much better compared to other latches. LC recommended the steps for latching until the baby is latching with depth consistently. Per mom breast are fuller, LC noted with assessment warmer and multiple swallows. LC showed mom the cross cradle.  Latch score 8  LC plan - prior to every latch until baby is consistently latching deeper.  Breast massage, hand express, pre - pump with hand pump if needed and reverse pressure ( stretch back exercise ) and firm support for the latch .  Rotate between football and cross cradle ( will help with depth)  LC reviewed BF D/C teaching and LC resources after D/C.  LC recommended and LC O/P and mom receptive to have the [facility] request and LC O/P appt for reassurance. LC placed the request in the epic basket for L/C O/P.    Maternal Data Has patient been taught Hand Expression?: Yes (steady flow of colostrum.) Does the patient have breastfeeding experience prior to this delivery?: Yes How long did the patient breastfeed?: x 2 - now 10 years and 41 years old  Feeding Mother's Current Feeding Choice: Breast Milk  LATCH Score Latch: Grasps breast easily, tongue down, lips flanged, rhythmical sucking.  Audible Swallowing: Spontaneous and intermittent  Type of Nipple: Everted at rest and after stimulation  Comfort (Breast/Nipple): Filling, red/small blisters or bruises, mild/mod discomfort  Hold (Positioning): Assistance needed to correctly position infant at breast and maintain latch.  LATCH Score: 8   Lactation  Tools Discussed/Used    Interventions Interventions: Breast feeding basics reviewed;Assisted with latch;Skin to skin;Breast massage;Hand express;Reverse pressure;Breast compression;Adjust position;Support pillows;Position options;Education;LC Services brochure  Discharge Pump: DEBP;Personal;Hands Free (per mom a DEBP and a Hand free) WIC Program: No  Consult Status Consult Status: Complete Date: 09/16/22 Follow-up type: In-patient    [place] 09/16/2022, 10:17 AM

## 2022-09-16 NOTE — Progress Notes (Signed)
PPD2 SVD:   S:  Pt reports feeling well/ Tolerating po/ Voiding without problems/ No n/v/ Bleeding is light/ Pain controlled withprescription NSAID's including ibuprofen (Motrin)  Newborn info live female BRF circ   O:  A & O x 3 / VS: Blood pressure 111/66, pulse 71, temperature 97.9 F (36.6 C), temperature source Oral, resp. rate 16, height 5\' 6"  (1.676 m), weight 104.3 kg, last menstrual period 06/26/2021, SpO2 99 %, unknown if currently breastfeeding.  LABS: No results found for this or any previous visit (from the past 24 hour(s)).  I&O: No intake/output data recorded.   No intake/output data recorded.  Lungs: chest clear, no wheezing, rales, normal symmetric air entry  Heart: regular rate and rhythm, S1, S2 normal, no murmur, click, rub or gallop  Abdomen: uterus firm @ umb NT  Perineum: is normal  Lochia: light  Extremities:no redness or tenderness in the calves or thighs, no edema    A/P: PPD # 2/ [id number]  Doing well  Continue routine post partum orders  D/c instructions reviewed F/u  6 wks

## 2022-09-16 NOTE — Discharge Summary (Signed)
Postpartum Discharge Summary  Date of Service updated     Patient Name: Emma Reynolds DOB: August 27, 1981 MRN: [id number]  Date of admission: 09/14/2022 Delivery date:09/14/2022  Delivering provider: Stormy Card  Date of discharge: 09/16/2022  Admitting diagnosis: Indication for care in labor or delivery [O75.9] Postpartum care following vaginal delivery [Z39.2] Intrauterine pregnancy: [redacted]w[redacted]d    Secondary diagnosis:  Principal Problem:   Indication for care in labor or delivery Active Problems:   Postpartum care following vaginal delivery  Additional problems: AMA    Discharge diagnosis: Term Pregnancy Delivered                                              Post partum procedures: n/a Augmentation: N/A Complications: None  Hospital course: Onset of Labor With Vaginal Delivery      41y.o. yo [id number] admitted in Active Labor on 09/14/2022. Labor course was complicated by none  Membrane Rupture Time/Date: 6:46 AM ,09/14/2022   Delivery Method:Vaginal, Spontaneous  Episiotomy: None  Lacerations:  Vaginal;Labial;Sulcus  Patient had a postpartum course complicated by none.  She is ambulating, tolerating a regular diet, passing flatus, and urinating well. Patient is discharged home in stable condition on 09/16/2022.  Newborn Data: Birth date:09/14/2022  Birth time:6:46 AM  Gender:Female  Living status:Living  Apgars:9 ,9  Weight:3.09 kg   Magnesium Sulfate received: No BMZ received: No Rhophylac:No MMR:N/A T-DaP:Given prenatally Flu: No Transfusion:No  Physical exam  Vitals:   09/15/22 0515 09/15/22 1400 09/15/22 1939 09/16/22 0423  BP: 114/68 113/83 121/67 111/66  Pulse: 66 65 67 71  Resp: _0 Temp: 98 F (36.7 C) 98.2 F (36.8 C) 97.9 F (36.6 C) 97.9 F (36.6 C)  TempSrc: Oral Oral Oral Oral  SpO2: 100% 99%    Weight:      Height:       General: alert, cooperative, and no distress Lochia: appropriate Uterine Fundus:  firm Incision: N/A DVT Evaluation: No evidence of DVT seen on physical exam. Labs: Lab Results  Component Value Date   WBC 12.3 (H) 09/15/2022   HGB 10.5 (L) 09/15/2022   HCT 30.0 (L) 09/15/2022   MCV 92.3 09/15/2022   PLT 262 09/15/2022      Latest Ref Rng & Units 09/14/2022    9:14 AM  CMP  Glucose 70 - 99 mg/dL 106    Edinburgh Score:    09/14/2022    1:55 PM  Edinburgh Postnatal Depression Scale Screening Tool  I have been able to laugh and see the funny side of things. 0  I have looked forward with enjoyment to things. 1  I have blamed myself unnecessarily when things went wrong. 1  I have been anxious or worried for no good reason. 2  I have felt scared or panicky for no good reason. 1  Things have been getting on top of me. 1  I have been so unhappy that I have had difficulty sleeping. 0  I have felt sad or miserable. 0  I have been so unhappy that I have been crying. 0  The thought of harming myself has occurred to me. 0  Edinburgh Postnatal Depression Scale Total 6      After visit meds:  Allergies as of 09/16/2022   No Known Allergies      Medication List  TAKE these medications    DIALYVITE VITAMIN D 5000 PO Take by mouth.   ibuprofen 600 MG tablet Commonly known as: ADVIL Take 1 tablet (600 mg total) by mouth every 6 (six) hours as needed.   PRENATAL VITAMIN PO Take 1 tablet by mouth daily.         Discharge home in stable condition Infant Feeding: Breast Infant Disposition:home with mother Discharge instruction: per After Visit Summary and Postpartum booklet. Activity: Advance as tolerated. Pelvic rest for 6 weeks.  Diet: routine diet Anticipated Birth Control: Unsure Postpartum Appointment:6 weeks Additional Postpartum F/U:  n/a Future Appointments:No future appointments. Follow up Visit:  Follow-up Information     Servando Salina, MD Follow up in 6 week(s).   Specialty: Obstetrics and Gynecology Contact  information: [address] [place] [address] [phone]                     09/16/2022 Marvene Staff, MD

## 2022-09-16 NOTE — Discharge Instructions (Signed)
Call if temperature greater than equal to 100.4, nothing per vagina for 4-6 weeks or severe nausea vomiting, increased incisional pain , drainage or redness in the incision site, no straining with bowel movements, showers no bath °

## 2022-09-16 NOTE — Plan of Care (Signed)
  Problem: Education: Goal: Knowledge of condition will improve Outcome: Adequate for Discharge Goal: Individualized Educational Video(s) Outcome: Adequate for Discharge Goal: Individualized Newborn Educational Video(s) Outcome: Adequate for Discharge   Problem: Activity: Goal: Will verbalize the importance of balancing activity with adequate rest periods Outcome: Adequate for Discharge Goal: Ability to tolerate increased activity will improve Outcome: Adequate for Discharge   Problem: Coping: Goal: Ability to identify and utilize available resources and services will improve Outcome: Adequate for Discharge   Problem: Life Cycle: Goal: Chance of risk for complications during the postpartum period will decrease Outcome: Adequate for Discharge   Problem: Role Relationship: Goal: Ability to demonstrate positive interaction with newborn will improve Outcome: Adequate for Discharge   Problem: Skin Integrity: Goal: Demonstration of wound healing without infection will improve Outcome: Adequate for Discharge   Problem: Education: Goal: Knowledge of General Education information will improve Description: Including pain rating scale, medication(s)/side effects and non-pharmacologic comfort measures Outcome: Adequate for Discharge   Problem: Health Behavior/Discharge Planning: Goal: Ability to manage health-related needs will improve Outcome: Adequate for Discharge   Problem: Clinical Measurements: Goal: Ability to maintain clinical measurements within normal limits will improve Outcome: Adequate for Discharge Goal: Will remain free from infection Outcome: Adequate for Discharge Goal: Diagnostic test results will improve Outcome: Adequate for Discharge Goal: Respiratory complications will improve Outcome: Adequate for Discharge Goal: Cardiovascular complication will be avoided Outcome: Adequate for Discharge   Problem: Activity: Goal: Risk for activity intolerance will  decrease Outcome: Adequate for Discharge   Problem: Nutrition: Goal: Adequate nutrition will be maintained Outcome: Adequate for Discharge   Problem: Coping: Goal: Level of anxiety will decrease Outcome: Adequate for Discharge   Problem: Elimination: Goal: Will not experience complications related to bowel motility Outcome: Adequate for Discharge Goal: Will not experience complications related to urinary retention Outcome: Adequate for Discharge   Problem: Pain Managment: Goal: General experience of comfort will improve Outcome: Adequate for Discharge   Problem: Safety: Goal: Ability to remain free from injury will improve Outcome: Adequate for Discharge   Problem: Skin Integrity: Goal: Risk for impaired skin integrity will decrease Outcome: Adequate for Discharge   

## 2022-09-18 ENCOUNTER — Inpatient Hospital Stay (HOSPITAL_COMMUNITY): Payer: BC Managed Care – PPO

## 2022-09-18 ENCOUNTER — Inpatient Hospital Stay (HOSPITAL_COMMUNITY): Admission: AD | Admit: 2022-09-18 | Payer: BC Managed Care – PPO | Source: Home / Self Care

## 2022-09-18 DIAGNOSIS — R102 Pelvic and perineal pain: Secondary | ICD-10-CM | POA: Diagnosis not present

## 2022-09-25 ENCOUNTER — Telehealth (HOSPITAL_COMMUNITY): Payer: Self-pay | Admitting: *Deleted

## 2022-09-25 NOTE — Telephone Encounter (Signed)
Mom reports feeling good. No concerns about herself at this time. EPDS=4 [facility] score=6) Mom reports baby is doing well. Feeding, peeing, and pooping without difficulty. Safe sleep reviewed. Mom reports no concerns about baby at present.  Odis Hollingshead, RN 09-25-2022 at 10:45am

## 2022-10-26 DIAGNOSIS — M6281 Muscle weakness (generalized): Secondary | ICD-10-CM | POA: Diagnosis not present

## 2022-10-26 DIAGNOSIS — R278 Other lack of coordination: Secondary | ICD-10-CM | POA: Diagnosis not present

## 2022-10-26 DIAGNOSIS — N3946 Mixed incontinence: Secondary | ICD-10-CM | POA: Diagnosis not present

## 2022-11-12 DIAGNOSIS — N3946 Mixed incontinence: Secondary | ICD-10-CM | POA: Diagnosis not present

## 2022-11-12 DIAGNOSIS — M6281 Muscle weakness (generalized): Secondary | ICD-10-CM | POA: Diagnosis not present

## 2022-11-12 DIAGNOSIS — R278 Other lack of coordination: Secondary | ICD-10-CM | POA: Diagnosis not present

## 2022-11-17 DIAGNOSIS — M6281 Muscle weakness (generalized): Secondary | ICD-10-CM | POA: Diagnosis not present

## 2022-11-17 DIAGNOSIS — N3946 Mixed incontinence: Secondary | ICD-10-CM | POA: Diagnosis not present

## 2022-11-17 DIAGNOSIS — R278 Other lack of coordination: Secondary | ICD-10-CM | POA: Diagnosis not present

## 2023-03-03 DIAGNOSIS — Z131 Encounter for screening for diabetes mellitus: Secondary | ICD-10-CM | POA: Diagnosis not present

## 2023-03-03 DIAGNOSIS — Z13220 Encounter for screening for lipoid disorders: Secondary | ICD-10-CM | POA: Diagnosis not present

## 2023-03-03 DIAGNOSIS — Z1329 Encounter for screening for other suspected endocrine disorder: Secondary | ICD-10-CM | POA: Diagnosis not present

## 2023-03-03 DIAGNOSIS — Z130 Encounter for screening for diseases of the blood and blood-forming organs and certain disorders involving the immune mechanism: Secondary | ICD-10-CM | POA: Diagnosis not present

## 2023-03-03 DIAGNOSIS — Z01419 Encounter for gynecological examination (general) (routine) without abnormal findings: Secondary | ICD-10-CM | POA: Diagnosis not present

## 2023-05-04 ENCOUNTER — Ambulatory Visit: Payer: BC Managed Care – PPO | Admitting: Internal Medicine

## 2023-07-16 ENCOUNTER — Ambulatory Visit: Payer: BC Managed Care – PPO | Admitting: Internal Medicine

## 2023-10-27 ENCOUNTER — Encounter: Payer: Self-pay | Admitting: Internal Medicine

## 2023-11-08 ENCOUNTER — Other Ambulatory Visit: Payer: Self-pay | Admitting: Family Medicine

## 2023-11-08 ENCOUNTER — Encounter: Payer: Self-pay | Admitting: Internal Medicine

## 2023-11-08 DIAGNOSIS — Z1231 Encounter for screening mammogram for malignant neoplasm of breast: Secondary | ICD-10-CM

## 2023-11-15 ENCOUNTER — Ambulatory Visit
Admission: RE | Admit: 2023-11-15 | Discharge: 2023-11-15 | Disposition: A | Discharge status: Home / Self Care | Payer: BC Managed Care – PPO | Source: Ambulatory Visit | Attending: Family Medicine | Admitting: Family Medicine

## 2023-11-15 DIAGNOSIS — Z1231 Encounter for screening mammogram for malignant neoplasm of breast: Secondary | ICD-10-CM

## 2023-11-17 ENCOUNTER — Other Ambulatory Visit: Payer: Self-pay | Admitting: Family Medicine

## 2023-11-17 DIAGNOSIS — R928 Other abnormal and inconclusive findings on diagnostic imaging of breast: Secondary | ICD-10-CM

## 2023-12-16 ENCOUNTER — Other Ambulatory Visit: Payer: Self-pay | Admitting: Obstetrics and Gynecology

## 2023-12-16 DIAGNOSIS — R928 Other abnormal and inconclusive findings on diagnostic imaging of breast: Secondary | ICD-10-CM

## 2023-12-17 ENCOUNTER — Other Ambulatory Visit: Payer: Self-pay | Admitting: Obstetrics and Gynecology

## 2023-12-17 ENCOUNTER — Ambulatory Visit
Admission: RE | Admit: 2023-12-17 | Discharge: 2023-12-17 | Disposition: A | Discharge status: Home / Self Care | Payer: BC Managed Care – PPO | Source: Ambulatory Visit | Attending: Family Medicine | Admitting: Family Medicine

## 2023-12-17 DIAGNOSIS — R928 Other abnormal and inconclusive findings on diagnostic imaging of breast: Secondary | ICD-10-CM

## 2023-12-17 DIAGNOSIS — N6001 Solitary cyst of right breast: Secondary | ICD-10-CM

## 2023-12-17 DIAGNOSIS — N6310 Unspecified lump in the right breast, unspecified quadrant: Secondary | ICD-10-CM

## 2023-12-21 ENCOUNTER — Telehealth: Payer: Self-pay | Admitting: *Deleted

## 2023-12-21 ENCOUNTER — Ambulatory Visit
Admission: RE | Admit: 2023-12-21 | Discharge: 2023-12-21 | Disposition: A | Discharge status: Home / Self Care | Payer: Medicaid Other | Source: Ambulatory Visit | Attending: Obstetrics and Gynecology | Admitting: Obstetrics and Gynecology

## 2023-12-21 DIAGNOSIS — N6310 Unspecified lump in the right breast, unspecified quadrant: Secondary | ICD-10-CM

## 2023-12-21 DIAGNOSIS — N6001 Solitary cyst of right breast: Secondary | ICD-10-CM

## 2023-12-21 HISTORY — PX: BREAST BIOPSY: SHX20

## 2023-12-21 NOTE — Telephone Encounter (Signed)
 Attempt to reach pt for pre-visit. LM with call back #.  Will attempt to reach again in 5 min due to no other # listed in profile  Second attempt to reach pt for pre-vist unsuccessful. LM with facility # for pt to call back. Instructed pt to call # given by end of the day and reschedule the pre-visit  with RN or the scheduled procedure will be canceled.

## 2023-12-23 LAB — SURGICAL PATHOLOGY

## 2024-01-04 ENCOUNTER — Encounter: Payer: BC Managed Care – PPO | Admitting: Internal Medicine

## 2024-02-16 ENCOUNTER — Telehealth (HOSPITAL_COMMUNITY): Payer: Self-pay

## 2024-02-16 ENCOUNTER — Ambulatory Visit (HOSPITAL_COMMUNITY)
Admission: EM | Admit: 2024-02-16 | Discharge: 2024-02-16 | Disposition: A | Discharge status: Home / Self Care | Attending: Emergency Medicine | Admitting: Emergency Medicine

## 2024-02-16 ENCOUNTER — Encounter (HOSPITAL_COMMUNITY): Payer: Self-pay

## 2024-02-16 DIAGNOSIS — J020 Streptococcal pharyngitis: Secondary | ICD-10-CM

## 2024-02-16 LAB — POCT RAPID STREP A (OFFICE): Rapid Strep A Screen: POSITIVE — AB

## 2024-02-16 MED ORDER — PENICILLIN G BENZATHINE 1200000 UNIT/2ML IM SUSY
PREFILLED_SYRINGE | INTRAMUSCULAR | Status: AC
  Filled 2024-02-16: qty 4

## 2024-02-16 MED ORDER — PENICILLIN G BENZATHINE 1200000 UNIT/2ML IM SUSY
1.2000 10*6.[IU] | PREFILLED_SYRINGE | Freq: Once | INTRAMUSCULAR | Status: AC
  Administered 2024-02-16: 1.2 10*6.[IU] via INTRAMUSCULAR

## 2024-02-16 MED ORDER — LIDOCAINE VISCOUS HCL 2 % MT SOLN: 15.0000 mL | OROMUCOSAL | 0 refills | Status: DC | PRN

## 2024-02-16 NOTE — Discharge Instructions (Addendum)
 You received an injection of penicillin here today for treatment of strep throat.  You do not need to take oral antibiotics since he received this injection today.  I prescribed lidocaine that you can gargle and spit as needed for sore throat.  Do not swallow this.  You can alternate between Tylenol and ibuprofen every 4-6 hours as needed for pain and fever.  Return here as needed.

## 2024-02-16 NOTE — ED Provider Notes (Signed)
 [facility]    CSN: [id number] Arrival date & time: 02/16/24  1336      History   Chief Complaint Chief Complaint  Patient presents with   Sore Throat   Lymphadenopathy    HPI Emma Reynolds is a 43 y.o. female.   Patient presents with sore throat and swollen lymph nodes x 2 days.  Patient states that she had chills yesterday, denies known fever.  Denies cough, congestion, headache, nausea, and vomiting.  Patient states that her husband has also been sick recently, but denies any other known sick contacts.   Sore Throat    Past Medical History:  Diagnosis Date   Anxiety    Fatty liver    Gallstones    Gilbert's syndrome 05/29/2018   Miscarriage     Patient Active Problem List   Diagnosis Date Noted   Indication for care in labor or delivery 09/14/2022   Postpartum care following vaginal delivery 09/14/2022   Abnormal weight gain 11/17/2021   Elevated liver enzymes 11/17/2021   Elevated cholesterol 11/17/2021   Anxiety 02/26/2020   Gilbert's syndrome 05/29/2018   Bunion 08/20/2016   Abnormal Pap smear 07/28/2013   GAD (generalized anxiety disorder) 07/28/2013   Post-inflammatory hyperpigmentation 07/28/2013   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 Authorizing Provider  lidocaine (XYLOCAINE) 2 % solution Use as directed 15 mLs in the mouth or throat as needed for mouth pain. 02/16/24  Yes Letta Kocher, NP    Family History Family History  Problem Relation  Age of Onset   Colon cancer Mother        Late 28s   Heart disease Mother    Kidney failure Mother    Hypertension Father    Breast cancer Paternal Aunt        70s   COPD Maternal Grandmother    Arthritis Maternal Grandmother    Stroke Maternal Grandfather    Heart disease Maternal Grandfather    Prostate cancer Maternal Grandfather    Heart attack Paternal Grandmother    Dementia Paternal Grandfather     Social History Social History   Tobacco Use   Smoking status: Never   Smokeless tobacco: Never  Vaping Use   Vaping status: Never Used  Substance Use Topics   Alcohol use: Yes    Comment: occasional   Drug use: Never     Allergies   Patient has no known allergies.   Review of Systems Review of Systems  Per HPI  Physical Exam Triage Vital Signs ED Triage Vitals  Encounter Vitals Group     BP 02/16/24 1454 122/84     Systolic BP Percentile --      Diastolic BP  Percentile --      Pulse Rate 02/16/24 1454 81     Resp 02/16/24 1454 16     Temp 02/16/24 1454 98.2 F (36.8 C)     Temp Source 02/16/24 1454 Oral     SpO2 02/16/24 1454 97 %     Weight --      Height --      Head Circumference --      Peak Flow --      Pain Score 02/16/24 1458 5     Pain Loc --      Pain Education --      Exclude from Growth Chart --    No data found.  Updated Vital Signs BP 122/84 (BP Location: Left Arm)   Pulse 81   Temp 98.2 F (36.8 C) (Oral)   Resp 16   LMP 02/06/2024   SpO2 97%   Visual Acuity Right Eye Distance:   Left Eye Distance:   Bilateral Distance:    Right Eye Near:   Left Eye Near:    Bilateral Near:     Physical Exam Vitals and nursing note reviewed.  Constitutional:      General: She is awake. She is not in acute distress.    Appearance: Normal appearance. She is well-developed and well-groomed. She is not ill-appearing.  HENT:     Right Ear: Tympanic membrane, ear canal and external ear normal.     Left Ear: Tympanic membrane, ear canal  and external ear normal.     Nose: Nose normal.     Mouth/Throat:     Mouth: Mucous membranes are moist.     Pharynx: Pharyngeal swelling, oropharyngeal exudate and posterior oropharyngeal erythema present.     Tonsils: Tonsillar exudate present.  Lymphadenopathy:     Cervical: Cervical adenopathy present.     Right cervical: Superficial cervical adenopathy present.     Left cervical: Superficial cervical adenopathy present.  Skin:    General: Skin is warm and dry.  Neurological:     Mental Status: She is alert.  Psychiatric:        Behavior: Behavior is cooperative.      UC Treatments / Results  Labs (all labs ordered are listed, but only abnormal results are displayed) Labs Reviewed  POCT RAPID STREP A (OFFICE) - Abnormal; Notable for the following components:      Result Value   Rapid Strep A Screen Positive (*)    All other components within normal limits    EKG   Radiology No results found.  Procedures Procedures (including critical care time)  Medications Ordered in UC Medications  penicillin g benzathine (BICILLIN LA) 1200000 UNIT/2ML injection 1.2 Million Units (1.2 Million Units Intramuscular Given 02/16/24 1548)    Initial Impression / Assessment and Plan / UC Course  I have reviewed the triage vital signs and the nursing notes.  Pertinent labs & imaging results that were available during my care of the patient were reviewed by me and considered in my medical decision making (see chart for details).     Upon assessment pharyngeal swelling, oropharyngeal exudate, pharyngeal erythema, and tonsillar exudate noted.  Bilateral facial cervical adenopathy also noted.  No other significant findings upon assessment.  Tested positive for strep A in clinic today.  Patient agreeable to Bicillin injection in clinic.  Prescribed lidocaine as needed for sore throat.  Discussed over-the-counter medication as needed.  Discussed return precautions. Final Clinical  Impressions(s) / UC Diagnoses   Final diagnoses:  Strep pharyngitis     Discharge Instructions      You received an injection of penicillin here today for treatment of strep throat.  You do not need to take oral antibiotics since he received this injection today.  I prescribed lidocaine that you can gargle and spit as needed for sore throat.  Do not swallow this.  You can alternate between Tylenol and ibuprofen every 4-6 hours as needed for pain and fever.  Return here as needed.   ED Prescriptions     Medication Sig Dispense Auth. Provider   lidocaine (XYLOCAINE) 2 % solution Use as directed 15 mLs in the mouth or throat as needed for mouth pain. 100 mL Wynonia Lawman A, NP      PDMP not reviewed this encounter.   Wynonia Lawman A, NP 02/16/24 1556

## 2024-02-16 NOTE — ED Triage Notes (Signed)
 Sore throat and swollen glands x 2 days. Denies any other symptoms.

## 2024-03-07 ENCOUNTER — Other Ambulatory Visit: Payer: Self-pay | Admitting: Obstetrics and Gynecology

## 2024-03-07 DIAGNOSIS — N6489 Other specified disorders of breast: Secondary | ICD-10-CM

## 2024-04-13 ENCOUNTER — Ambulatory Visit

## 2024-04-13 VITALS — Ht 66.0 in | Wt 203.0 lb

## 2024-04-13 DIAGNOSIS — Z800 Family history of malignant neoplasm of digestive organs: Secondary | ICD-10-CM

## 2024-04-13 MED ORDER — NA SULFATE-K SULFATE-MG SULF 17.5-3.13-1.6 GM/177ML PO SOLN: 1.0000 | Freq: Once | ORAL | 0 refills | Status: AC

## 2024-04-13 NOTE — Progress Notes (Signed)
 No egg or soy allergy known to patient  No issues known to pt with past sedation with any surgeries or procedures Patient denies ever being told they had issues or difficulty with intubation  No FH of Malignant Hyperthermia Pt is not on diet pills; takes GLP-1 medication Pt is not on  home 02  Pt is not on blood thinners  Pt denies issues with chronic constipation  No A fib or A flutter Have any cardiac testing pending--no Ambulates independently

## 2024-05-03 NOTE — Progress Notes (Unsigned)
 [facility] History and Physical   Primary Care Physician:  Abram Abraham, NP-C   Reason for Procedure:  Family history of colon cancer  Plan:    Colonoscopy     HPI: Emma Reynolds is a 43 y.o. female here today for a colonoscopy due to increased risk of colon cancer in the setting of family history of colon cancer.  Her mother had colon cancer diagnosed in her 8s.   Past Medical History:  Diagnosis Date   Anxiety    Fatty liver    Gallstones    Gilbert's syndrome 05/29/2018   Miscarriage     Past Surgical History:  Procedure Laterality Date   BREAST BIOPSY Right 12/21/2023   US  RT BREAST BX W LOC DEV 1ST LESION IMG BX SPEC US  GUIDE 12/21/2023 [facility]AMMOGRAP[facility]   BUNIONECTOMY     CHOLECYSTECTOMY  2016    Prior to Admission medications   Medication Sig Start Date End Date Taking? Authorizing Provider  SEMAGLUTIDE, 1 MG/DOSE, [place] Inject 1 mg into the skin once a week.    [provider]    Current Outpatient Medications  Medication Sig Dispense Refill   SEMAGLUTIDE, 1 MG/DOSE, [place] Inject 1 mg into the skin once a week.     No current facility-administered medications for this visit.    Allergies as of 05/04/2024   (No Known Allergies)    Family History  Problem Relation Age of Onset   Colon cancer Mother        Late 31s   Heart disease Mother    Kidney failure Mother    Hypertension Father    Diverticulitis Maternal Uncle    Breast cancer Paternal Aunt        15s   COPD Maternal Grandmother    Arthritis Maternal Grandmother    Stroke Maternal Grandfather    Heart disease Maternal Grandfather    Prostate cancer Maternal Grandfather    Heart attack Paternal Grandmother    Dementia Paternal Grandfather    Esophageal cancer Neg Hx    Stomach cancer Neg Hx    Rectal cancer Neg Hx     Social History   Socioeconomic History   Marital status: Married    Spouse name: Not on file   Number of children: 2   Years of education: Not on  file   Highest education level: Not on file  Occupational History   Occupation: Emergency planning/management officer  Tobacco Use   Smoking status: Never   Smokeless tobacco: Never  Vaping Use   Vaping status: Never Used  Substance and Sexual Activity   Alcohol use: Yes    Comment: occasional   Drug use: Never   Sexual activity: Yes    Partners: Male    Birth control/protection: None  Other Topics Concern   Not on file  Social History Narrative   She is married 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   2 sons one born 2013 1 born 2015.   Never smoker never user of drugs occasional alcohol not more than 1 and a day and not more than 1 caffeinated beverage a day   Social Drivers of Corporate investment banker Strain: Not on file  Food Insecurity: No Food Insecurity (09/14/2022)   Hunger Vital Sign    Worried About Running Out of Food in the Last Year: Never true    Ran Out of Food in the Last Year: Never  true  Transportation Needs: No Transportation Needs (09/14/2022)   PRAPARE - Administrator, Civil Service (Medical): No    Lack of Transportation (Non-Medical): No  Physical Activity: Not on file  Stress: Not on file  Social Connections: Not on file  Intimate Partner Violence: Not At Risk (09/14/2022)   Humiliation, Afraid, Rape, and Kick questionnaire    Fear of Current or Ex-Partner: No    Emotionally Abused: No    Physically Abused: No    Sexually Abused: No    Review of Systems: Positive for *** All other review of systems negative except as mentioned in the HPI.  Physical Exam: Vital signs There were no vitals taken for this visit.  General:   Alert,  Well-developed, well-nourished, pleasant and cooperative in NAD Lungs:  Clear throughout to auscultation.   Heart:  Regular rate and rhythm; no murmurs, clicks, rubs,  or gallops. Abdomen:  Soft, nontender and nondistended. Normal bowel sounds.    Neuro/Psych:  Alert and cooperative. Normal mood and affect. A and O x 3   @Omaria Plunk  Tammie Fall, MD, [facility] [phone] (pager) 05/03/2024 8:30 PM@

## 2024-05-04 ENCOUNTER — Encounter: Payer: Self-pay | Admitting: Internal Medicine

## 2024-05-04 ENCOUNTER — Ambulatory Visit: Admitting: Internal Medicine

## 2024-05-04 VITALS — BP 119/80 | HR 72 | Temp 98.3°F | Resp 13 | Ht 66.0 in | Wt 203.0 lb

## 2024-05-04 DIAGNOSIS — Z1211 Encounter for screening for malignant neoplasm of colon: Secondary | ICD-10-CM

## 2024-05-04 DIAGNOSIS — K5730 Diverticulosis of large intestine without perforation or abscess without bleeding: Secondary | ICD-10-CM

## 2024-05-04 DIAGNOSIS — D125 Benign neoplasm of sigmoid colon: Secondary | ICD-10-CM

## 2024-05-04 DIAGNOSIS — Z800 Family history of malignant neoplasm of digestive organs: Secondary | ICD-10-CM | POA: Diagnosis not present

## 2024-05-04 MED ORDER — SODIUM CHLORIDE 0.9 % IV SOLN: 500.0000 mL | Freq: Once | INTRAVENOUS | Status: DC

## 2024-05-04 NOTE — Patient Instructions (Addendum)
 I found and removed one polyp that looks benign but pre-cancerous.  I will let you know pathology results and when to have another routine colonoscopy by mail and/or My Chart.  You also have a condition called diverticulosis - common and not usually a problem. Please read the handout provided.  I appreciate the opportunity to care for you. Kenney Peacemaker, MD, FACG  YOU HAD AN ENDOSCOPIC PROCEDURE TODAY AT THE [facility]:   Refer to the procedure report that was given to you for any specific questions about what was found during the examination.  If the procedure report does not answer your questions, please call your gastroenterologist to clarify.  If you requested that your care partner not be given the details of your procedure findings, then the procedure report has been included in a sealed envelope for you to review at your convenience later.  YOU SHOULD EXPECT: Some feelings of bloating in the abdomen. Passage of more gas than usual.  Walking can help get rid of the air that was put into your GI tract during the procedure and reduce the bloating. If you had a lower endoscopy (such as a colonoscopy or flexible sigmoidoscopy) you may notice spotting of blood in your stool or on the toilet paper. If you underwent a bowel prep for your procedure, you may not have a normal bowel movement for a few days.  Please Note:  You might notice some irritation and congestion in your nose or some drainage.  This is from the oxygen used during your procedure.  There is no need for concern and it should clear up in a day or so.  SYMPTOMS TO REPORT IMMEDIATELY:  Following lower endoscopy (colonoscopy or flexible sigmoidoscopy):  Excessive amounts of blood in the stool  Significant tenderness or worsening of abdominal pains  Swelling of the abdomen that is new, acute  Fever of 100F or higher  Following upper endoscopy (EGD)  Vomiting of blood or coffee ground material  New chest pain or  pain under the shoulder blades  Painful or persistently difficult swallowing  New shortness of breath  Fever of 100F or higher  Black, tarry-looking stools  For urgent or emergent issues, a gastroenterologist can be reached at any hour by calling [phone]. Do not use MyChart messaging for urgent concerns.    DIET:  We do recommend a small meal at first, but then you may proceed to your regular diet.  Drink plenty of fluids but you should avoid alcoholic beverages for 24 hours.  ACTIVITY:  You should plan to take it easy for the rest of today and you should NOT DRIVE or use heavy machinery until tomorrow (because of the sedation medicines used during the test).    FOLLOW UP: Our staff will call the number listed on your records the next business day following your procedure.  We will call around 7:15- 8:00 am to check on you and address any questions or concerns that you may have regarding the information given to you following your procedure. If we do not reach you, we will leave a message.     If any biopsies were taken you will be contacted by phone or by letter within the next 1-3 weeks.  Please call us  at [phone] if you have not heard about the biopsies in 3 weeks.    SIGNATURES/CONFIDENTIALITY: You and/or your care partner have signed paperwork which will be entered into your electronic medical record.  These signatures attest  to the fact that that the information above on your After Visit Summary has been reviewed and is understood.  Full responsibility of the confidentiality of this discharge information lies with you and/or your care-partner.

## 2024-05-04 NOTE — Op Note (Signed)
 [facility] Patient Name: Emma Reynolds Procedure Date: 05/04/2024 10:21 AM MRN: [id number] Endoscopist: Kenney Peacemaker , MD, [id number] Age: 43 Referring MD:  Date of Birth: 1981-08-12 Gender: Female Account #: [id number] Procedure:                Colonoscopy Indications:              Screening in patient at increased risk: Colorectal                            cancer in mother before age 24 Medicines:                Monitored Anesthesia Care Procedure:                Pre-Anesthesia Assessment:                           - Prior to the procedure, a History and Physical                            was performed, and patient medications and                            allergies were reviewed. The patient's tolerance of                            previous anesthesia was also reviewed. The risks                            and benefits of the procedure and the sedation                            options and risks were discussed with the patient.                            All questions were answered, and informed consent                            was obtained. Prior Anticoagulants: The patient has                            taken no anticoagulant or antiplatelet agents. ASA                            Grade Assessment: II - A patient with mild systemic                            disease. After reviewing the risks and benefits,                            the patient was deemed in satisfactory condition to                            undergo the procedure.  After obtaining informed consent, the colonoscope                            was passed under direct vision. Throughout the                            procedure, the patient's blood pressure, pulse, and                            oxygen saturations were monitored continuously. The                            CF [id number] #[id number] was introduced through the anus                            and advanced to the the  cecum, identified by                            appendiceal orifice and ileocecal valve. The                            colonoscopy was performed without difficulty. The                            patient tolerated the procedure well. The quality                            of the bowel preparation was good. The ileocecal                            valve, appendiceal orifice, and rectum were                            photographed. The bowel preparation used was SUPREP                            via split dose instruction. Scope In: 11:12:58 AM Scope Out: 11:31:25 AM Scope Withdrawal Time: 0 hours 9 minutes 36 seconds  Total Procedure Duration: 0 hours 18 minutes 27 seconds  Findings:                 The perianal and digital rectal examinations were                            normal.                           A 12 mm polyp was found in the proximal sigmoid                            colon. The polyp was pedunculated. The polyp was                            removed with a hot snare. Resection and retrieval  were complete. Verification of patient                            identification for the specimen was done. Estimated                            blood loss: none.                           Multiple small-mouthed diverticula were found in                            the sigmoid colon.                           The exam was otherwise without abnormality on                            direct and retroflexion views. Complications:            No immediate complications. Estimated Blood Loss:     Estimated blood loss: none. Impression:               - One 12 mm polyp in the proximal sigmoid colon,                            removed with a hot snare. Resected and retrieved.                           - Diverticulosis in the sigmoid colon.                           - The examination was otherwise normal on direct                            and retroflexion views.                            Family history colon cancer in mother before age 47 Recommendation:           - Patient has a contact number available for                            emergencies. The signs and symptoms of potential                            delayed complications were discussed with the                            patient. Return to normal activities tomorrow.                            Written discharge instructions were provided to the                            patient.                           -  Resume previous diet.                           - Continue present medications.                           - No aspirin, ibuprofen , naproxen, or other                            non-steroidal anti-inflammatory drugs for 2 weeks                            after polyp removal. Kenney Peacemaker, MD 05/04/2024 11:42:34 AM This report has been signed electronically.

## 2024-05-04 NOTE — Progress Notes (Signed)
 Pt's states no medical or surgical changes since previsit or office visit.

## 2024-05-04 NOTE — Progress Notes (Signed)
 Called to room to assist during endoscopic procedure.  Patient ID and intended procedure confirmed with present staff. Received instructions for my participation in the procedure from the performing physician.

## 2024-05-04 NOTE — Progress Notes (Signed)
 Sedate, gd SR, tolerated procedure well, VSS, report to RN

## 2024-05-05 ENCOUNTER — Ambulatory Visit (HOSPITAL_COMMUNITY)
Admission: RE | Admit: 2024-05-05 | Discharge: 2024-05-05 | Disposition: A | Discharge status: Home / Self Care | Source: Ambulatory Visit | Attending: Emergency Medicine | Admitting: Emergency Medicine

## 2024-05-05 ENCOUNTER — Telehealth: Payer: Self-pay | Admitting: *Deleted

## 2024-05-05 ENCOUNTER — Encounter (HOSPITAL_COMMUNITY): Payer: Self-pay

## 2024-05-05 VITALS — BP 112/77 | HR 66 | Temp 98.5°F | Resp 20

## 2024-05-05 DIAGNOSIS — L03211 Cellulitis of face: Secondary | ICD-10-CM

## 2024-05-05 MED ORDER — IBUPROFEN 800 MG PO TABS: 800.0000 mg | ORAL_TABLET | Freq: Three times a day (TID) | ORAL | 0 refills | Status: AC | PRN

## 2024-05-05 MED ORDER — CEFTRIAXONE SODIUM 1 G IJ SOLR
INTRAMUSCULAR | Status: AC
  Filled 2024-05-05: qty 10

## 2024-05-05 MED ORDER — LIDOCAINE HCL (PF) 1 % IJ SOLN
INTRAMUSCULAR | Status: AC
Start: 2024-05-05 — End: ?
  Filled 2024-05-05: qty 2

## 2024-05-05 MED ORDER — LIDOCAINE HCL (PF) 1 % IJ SOLN
INTRAMUSCULAR | Status: AC
  Filled 2024-05-05: qty 2

## 2024-05-05 MED ORDER — PENICILLIN V POTASSIUM 500 MG PO TABS: 500.0000 mg | ORAL_TABLET | Freq: Three times a day (TID) | ORAL | 0 refills | Status: AC

## 2024-05-05 MED ORDER — CEFTRIAXONE SODIUM 1 G IJ SOLR
1.0000 g | Freq: Once | INTRAMUSCULAR | Status: AC
  Administered 2024-05-05: 1 g via INTRAMUSCULAR

## 2024-05-05 NOTE — Telephone Encounter (Signed)
  Follow up Call-     05/04/2024   10:32 AM  Call back number  Post procedure Call Back phone  # [phone]  Permission to leave phone message Yes    Attempted to leave message; phone cut off while leaving message

## 2024-05-05 NOTE — Discharge Instructions (Signed)
 To treat the infection that may be involving either your sinuses or your teeth, you received an injection of ceftriaxone during your visit today.  Please pick up your prescription for penicillin  from your pharmacy and begin taking 1 tablet 3 times daily.  If you can get your first 2 doses in before bedtime tonight, making sure each dose is 6 hours apart, that would be ideal.  I have also sent a prescription for ibuprofen  100 mg tablets that you can take for pain as needed.  Even if your facial swelling is not completely resolved, please make sure that you keep your appointment with your dentist on Monday.  Thank you for visiting [facility] today.

## 2024-05-05 NOTE — ED Triage Notes (Signed)
 Pt  reports her left cheek has been swollen x 2 weeks    Painful near right eye to the touch

## 2024-05-05 NOTE — ED Provider Notes (Signed)
 [facility]    CSN: [id number] Arrival date & time: 05/05/24  1224    HISTORY   Chief Complaint  Patient presents with   Abscess    Swollen cheek either from abcess or sinus - Entered by patient   HPI Emma Reynolds is a pleasant, 43 y.o. female who presents to urgent care today. Patient states that approximately 4 weeks ago, she was chewing on hard gumball and believes that she might have chipped one of her molars on the upper left side.  Patient states she has not had any dental pain, gingival pain or swelling since this occurred but has been experiencing pain in her left maxillary sinus consistently since then.  Patient states she also experiences sinusitis when the weather is bad such as thunderstorms and heavy rain as we have been having recently.  Patient states she is having trouble differentiating whether or not the pain and swelling she is experiencing at this time from her left maxillary sinus, to under her left eye down her cheek to the level of her chin is related to sinus or her teeth.  Patient states she had some leftover amoxicillin that she took for 3 days, completed this about 3 weeks ago, and states that symptoms nearly resolved but then returned a few days later.  Patient states she has since been taking ibuprofen  and applying ice to the left side of face as well which has helped reduce some of the swelling.  Patient states she has an appointment with her dentist on Monday (today is Friday) for a cleaning and is hoping that she will be able to be seen despite the swelling in her face.  The history is provided by the patient.   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 Authorizing Provider  SEMAGLUTIDE, 1 MG/DOSE, [place] Inject 1 mg into the skin once a week.    [provider]  ZEPBOUND 2.5 MG/0.5ML Pen Inject 2.5 mg into the skin once a week. Patient not taking: Reported on 05/04/2024 05/03/24   [provider]  Family History Family History  Problem Relation Age of Onset   Colon cancer Mother        Late 57s   Heart disease Mother    Kidney failure Mother    Hypertension Father    Diverticulitis Maternal Uncle    Breast cancer Paternal Aunt        48s   COPD Maternal Grandmother    Arthritis Maternal Grandmother    Stroke Maternal Grandfather    Heart disease Maternal Grandfather    Prostate cancer Maternal Grandfather    Heart attack Paternal Grandmother    Dementia Paternal Grandfather    Esophageal cancer Neg Hx    Stomach cancer Neg Hx    Rectal cancer Neg Hx    Social History Social History   Tobacco Use   Smoking status: Never   Smokeless tobacco: Never  Vaping Use   Vaping status: Never Used  Substance Use Topics   Alcohol use: Yes    Comment: occasional   Drug use: Never   Allergies   Patient has no  known allergies.  Review of Systems Review of Systems Pertinent findings revealed after performing a 14 point review of systems has been noted in the history of present illness.  Physical Exam Vital Signs BP 112/77 (BP Location: Right Arm)   Pulse 66   Temp 98.5 F (36.9 C) (Oral)   Resp 20   LMP 04/06/2024 (Exact Date)   SpO2 97%   No data found.  Physical Exam Vitals and nursing note reviewed.  Constitutional:      General: She is not in acute distress.    Appearance: Normal appearance. She is ill-appearing.  HENT:     Head: Normocephalic. Left periorbital erythema present.     Jaw: There is normal jaw occlusion.     Salivary Glands: Right salivary gland is not diffusely enlarged or tender. Left salivary gland is not diffusely enlarged or tender.      Right Ear: Hearing, tympanic membrane, ear canal and external ear normal.     Left Ear: Hearing, tympanic membrane, ear canal and external ear normal.     Nose: No mucosal edema, congestion or rhinorrhea.     Right Sinus: No maxillary sinus tenderness or frontal sinus tenderness.     Left Sinus: Maxillary sinus tenderness present. No frontal sinus tenderness.      Mouth/Throat:     Lips: Pink. No lesions.     Mouth: Mucous membranes are moist. No oral lesions.     Dentition: Normal dentition. Does not have dentures. No dental tenderness, gingival swelling, dental caries, dental abscesses or gum lesions.     Tongue: No lesions. Tongue does not deviate from midline.     Palate: No mass and lesions.     Pharynx: Oropharynx is clear. Uvula midline. No pharyngeal swelling, posterior oropharyngeal erythema or uvula swelling.     Tonsils: 0 on the right. 0 on the left.  Neurological:     Mental Status: She is alert.     Visual Acuity Right Eye Distance:   Left Eye Distance:   Bilateral Distance:    Right Eye Near:   Left Eye Near:    Bilateral Near:     UC Couse / Diagnostics / Procedures:     Radiology No results  found.  Procedures Procedures (including critical care time) EKG  Pending results:  Labs Reviewed - No data to display  Medications Ordered in UC: Medications  cefTRIAXone (ROCEPHIN) injection 1 g (  1 g Intramuscular Given 05/05/24 1311)    UC Diagnoses / Final Clinical Impressions(s)   I have reviewed the triage vital signs and the nursing notes.  Pertinent labs & imaging results that were available during my care of the patient were reviewed by me and considered in my medical decision making (see chart for details).    Final diagnoses:  Cellulitis, face   Do not appreciate any signs of dental infection or obvious dental injury, gingival swelling or infection.  Patient is very tender around her left maxillary sinus and swelling seems to radiate laterally and inferiorly from this area.  Patient did have modest improvement of symptoms after 3 days of leftover antibiotics 2 weeks ago, believe it is crucial that she begin antibiotics for treatment of cellulitis given this inappropriate use of antibiotics which is likely resulted in a more serious infection.  Patient provided with injection of Rocephin during her visit today and advised to begin penicillin  500 mg 3 times daily for the next 10 days.  Ibuprofen  800 mg recommended for pain relief.  Conservative care recommended.  Return precautions advised.  Please see discharge instructions below for details of plan of care as provided to patient. ED Prescriptions     Medication Sig Dispense Auth. Provider   penicillin  v potassium (VEETID) 500 MG tablet Take 1 tablet (500 mg total) by mouth 3 (three) times daily for 10 days. 30 tablet Eloise Hake Scales, PA-C   ibuprofen  (ADVIL ) 800 MG tablet Take 1 tablet (800 mg total) by mouth every 8 (eight) hours as needed for up to 10 days. 30 tablet Eloise Hake Scales, PA-C      PDMP not reviewed this encounter.  Pending results:  Labs Reviewed - No data to display    Discharge  Instructions      To treat the infection that may be involving either your sinuses or your teeth, you received an injection of ceftriaxone during your visit today.  Please pick up your prescription for penicillin  from your pharmacy and begin taking 1 tablet 3 times daily.  If you can get your first 2 doses in before bedtime tonight, making sure each dose is 6 hours apart, that would be ideal.  I have also sent a prescription for ibuprofen  100 mg tablets that you can take for pain as needed.  Even if your facial swelling is not completely resolved, please make sure that you keep your appointment with your dentist on Monday.  Thank you for visiting [facility] today.     Disposition Upon Discharge:  Condition: stable for discharge home  Patient presented with an acute illness with associated systemic symptoms and significant discomfort requiring urgent management. In my opinion, this is a condition that a prudent lay person (someone who possesses an average knowledge of health and medicine) may potentially expect to result in complications if not addressed urgently such as respiratory distress, impairment of bodily function or dysfunction of bodily organs.   Routine symptom specific, illness specific and/or disease specific instructions were discussed with the patient and/or caregiver at length.   As such, the patient has been evaluated and assessed, work-up was performed and treatment was provided in alignment with urgent care protocols and evidence based medicine.  Patient/parent/caregiver has been advised that the patient may require follow up for further testing and treatment if the symptoms continue in spite of treatment, as clinically indicated and appropriate.  Patient/parent/caregiver has been advised to return to the [facility] or PCP if  no better; to PCP or the Emergency Department if new signs and symptoms develop, or if the current signs or symptoms continue to change or  worsen for further workup, evaluation and treatment as clinically indicated and appropriate  The patient will follow up with their current PCP if and as advised. If the patient does not currently have a PCP we will assist them in obtaining one.   The patient may need specialty follow up if the symptoms continue, in spite of conservative treatment and management, for further workup, evaluation, consultation and treatment as clinically indicated and appropriate.  Patient/parent/caregiver verbalized understanding and agreement of plan as discussed.  All questions were addressed during visit.  Please see discharge instructions below for further details of plan.  This office note has been dictated using Teaching laboratory technician.  Unfortunately, this method of dictation can sometimes lead to typographical or grammatical errors.  I apologize for your inconvenience in advance if this occurs.  Please do not hesitate to reach out to me if clarification is needed.      Eloise Hake Scales, [place] 05/05/24 [phone]

## 2024-05-09 LAB — SURGICAL PATHOLOGY

## 2024-05-12 ENCOUNTER — Encounter: Payer: Self-pay | Admitting: Internal Medicine

## 2024-05-12 ENCOUNTER — Ambulatory Visit: Payer: Self-pay | Admitting: Internal Medicine

## 2024-05-12 DIAGNOSIS — Z860101 Personal history of adenomatous and serrated colon polyps: Secondary | ICD-10-CM

## 2024-05-12 HISTORY — DX: Personal history of adenomatous and serrated colon polyps: Z86.0101

## 2024-06-02 ENCOUNTER — Encounter: Payer: Self-pay | Admitting: Family Medicine

## 2024-06-02 ENCOUNTER — Ambulatory Visit: Admitting: Family Medicine

## 2024-06-02 VITALS — BP 114/82 | HR 59 | Temp 97.8°F | Ht 66.0 in | Wt 205.0 lb

## 2024-06-02 DIAGNOSIS — E559 Vitamin D deficiency, unspecified: Secondary | ICD-10-CM | POA: Diagnosis not present

## 2024-06-02 DIAGNOSIS — M545 Low back pain, unspecified: Secondary | ICD-10-CM | POA: Insufficient documentation

## 2024-06-02 DIAGNOSIS — E7800 Pure hypercholesterolemia, unspecified: Secondary | ICD-10-CM

## 2024-06-02 DIAGNOSIS — R748 Abnormal levels of other serum enzymes: Secondary | ICD-10-CM

## 2024-06-02 DIAGNOSIS — E669 Obesity, unspecified: Secondary | ICD-10-CM | POA: Insufficient documentation

## 2024-06-02 DIAGNOSIS — G8929 Other chronic pain: Secondary | ICD-10-CM | POA: Insufficient documentation

## 2024-06-02 DIAGNOSIS — E538 Deficiency of other specified B group vitamins: Secondary | ICD-10-CM | POA: Diagnosis not present

## 2024-06-02 DIAGNOSIS — Z6833 Body mass index (BMI) 33.0-33.9, adult: Secondary | ICD-10-CM

## 2024-06-02 LAB — CBC WITH DIFFERENTIAL/PLATELET
Basophils Absolute: 0 10*3/uL (ref 0.0–0.1)
Basophils Relative: 0.4 % (ref 0.0–3.0)
Eosinophils Absolute: 0.1 10*3/uL (ref 0.0–0.7)
Eosinophils Relative: 1.7 % (ref 0.0–5.0)
HCT: 36.8 % (ref 36.0–46.0)
Hemoglobin: 12.4 g/dL (ref 12.0–15.0)
Lymphocytes Relative: 33.5 % (ref 12.0–46.0)
Lymphs Abs: 2.6 10*3/uL (ref 0.7–4.0)
MCHC: 33.8 g/dL (ref 30.0–36.0)
MCV: 92.3 fl (ref 78.0–100.0)
Monocytes Absolute: 0.5 10*3/uL (ref 0.1–1.0)
Monocytes Relative: 6.2 % (ref 3.0–12.0)
Neutro Abs: 4.5 10*3/uL (ref 1.4–7.7)
Neutrophils Relative %: 58.2 % (ref 43.0–77.0)
Platelets: 354 10*3/uL (ref 150.0–400.0)
RBC: 3.98 Mil/uL (ref 3.87–5.11)
RDW: 13.3 % (ref 11.5–15.5)
WBC: 7.7 10*3/uL (ref 4.0–10.5)

## 2024-06-02 LAB — HEMOGLOBIN A1C: Hgb A1c MFr Bld: 5.2 % (ref 4.6–6.5)

## 2024-06-02 LAB — LIPID PANEL
Cholesterol: 165 mg/dL (ref 0–200)
HDL: 37.7 mg/dL — ABNORMAL LOW (ref >39.00)
LDL Cholesterol: 113 mg/dL — ABNORMAL HIGH (ref 0–99)
NonHDL: 126.89
Total CHOL/HDL Ratio: 4
Triglycerides: 68 mg/dL (ref 0.0–149.0)
VLDL: 13.6 mg/dL (ref 0.0–40.0)

## 2024-06-02 LAB — HEPATIC FUNCTION PANEL
ALT: 61 U/L — ABNORMAL HIGH (ref 0–35)
AST: 21 U/L (ref 0–37)
Albumin: 4.1 g/dL (ref 3.5–5.2)
Alkaline Phosphatase: 61 U/L (ref 39–117)
Bilirubin, Direct: 0.1 mg/dL (ref 0.0–0.3)
Total Bilirubin: 0.9 mg/dL (ref 0.2–1.2)
Total Protein: 7.7 g/dL (ref 6.0–8.3)

## 2024-06-02 LAB — BASIC METABOLIC PANEL WITH GFR
BUN: 9 mg/dL (ref 6–23)
CO2: 27 meq/L (ref 19–32)
Calcium: 8.7 mg/dL (ref 8.4–10.5)
Chloride: 105 meq/L (ref 96–112)
Creatinine, Ser: 0.63 mg/dL (ref 0.40–1.20)
GFR: 110.85 mL/min (ref >60.00)
Glucose, Bld: 90 mg/dL (ref 70–99)
Potassium: 3.8 meq/L (ref 3.5–5.1)
Sodium: 138 meq/L (ref 135–145)

## 2024-06-02 LAB — TSH: TSH: 1.1 u[IU]/mL (ref 0.35–5.50)

## 2024-06-02 LAB — VITAMIN B12: Vitamin B-12: 241 pg/mL (ref 211–911)

## 2024-06-02 LAB — T4, FREE: Free T4: 0.63 ng/dL (ref 0.60–1.60)

## 2024-06-02 LAB — VITAMIN D 25 HYDROXY (VIT D DEFICIENCY, FRACTURES): VITD: 23.21 ng/mL — ABNORMAL LOW (ref 30.00–100.00)

## 2024-06-02 NOTE — Progress Notes (Unsigned)
 New Patient Office Visit  Subjective    Patient ID: Emma Reynolds, female    DOB: 1981/02/15  Age: 43 y.o. MRN: [id number]  CC:  Chief Complaint  Patient presents with   Establish Care    Hx of elevated liver enzymes, wants to check on those in blood work.   Lower back pain, had baby 19 months ago and went to therapy but didn't help. Especially when bending down.     HPI Emma Reynolds presents to establish care No PCP in past   OB/GYN- Dr. Rutherford  Dentist    Weight issues- losing weight on Zepbound. Currently on 5 mg dose. This is through [facility] clinic.  Exercises  She is eating a healthy diet in general. Drinks a lot of water.   Hx of elevated liver enzymes.   Married. Has a 32 month old and a 9 yo, 22 yo Husband is going to get a vasectomy  Is not breastfeeding  Having her cycles.   Gallbladder removed   Colonoscopy 2 months ago. Recall in 3 years.  Mother with colon cancer.    Bunion on left foot- states she needs surgery.  Hx of surgery on right foot in [place] at age 55 for bunion.      Outpatient Encounter Medications as of 06/02/2024  Medication Sig   ZEPBOUND 5 MG/0.5ML Pen    [DISCONTINUED] SEMAGLUTIDE, 1 MG/DOSE, [place] Inject 1 mg into the skin once a week.   No facility-administered encounter medications on file as of 06/02/2024.    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    Past Surgical History:  Procedure Laterality Date   BREAST BIOPSY Right 12/21/2023   US  RT BREAST BX W LOC DEV 1ST LESION IMG BX SPEC US  GUIDE 12/21/2023 [facility]   BUNIONECTOMY     CHOLECYSTECTOMY  2016   COLONOSCOPY      Family History  Problem Relation Age of Onset   Colon cancer Mother        Late 32s   Heart  disease Mother    Kidney failure Mother    Cancer Mother    Hypertension Mother    Kidney disease Mother    Obesity Mother    Hypertension Father    Diverticulitis Maternal Uncle    Breast cancer Paternal Aunt        31s   COPD Maternal Grandmother    Arthritis Maternal Grandmother    Stroke Maternal Grandfather    Heart disease Maternal Grandfather    Prostate cancer Maternal Grandfather    Cancer Maternal Grandfather    Heart attack Paternal Grandmother    Dementia Paternal Grandfather    ADD / ADHD Son    Anxiety disorder Son    Hypertension Sister    Miscarriages / Stillbirths Sister    Obesity Sister    Hypertension Brother    Obesity Brother    Esophageal cancer Neg Hx    Stomach cancer Neg Hx    Rectal cancer Neg Hx     Social History   Socioeconomic History   Marital status: Married    Spouse name: Not on file   Number of children:  2   Years of education: Not on file   Highest education level: Not on file  Occupational History   Occupation: Emergency planning/management officer  Tobacco Use   Smoking status: Never   Smokeless tobacco: Never  Vaping Use   Vaping status: Never Used  Substance and Sexual Activity   Alcohol use: Yes    Comment: occasional   Drug use: Never   Sexual activity: Yes    Partners: Male    Birth control/protection: None  Other Topics Concern   Not on file  Social History Narrative   She is married 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   2 sons one born 2013 1 born 2015.   Never smoker never user of drugs occasional alcohol not more than 1 and a day and not more than 1 caffeinated beverage a day   Social Drivers of Corporate investment banker Strain: Not on file  Food Insecurity: No Food Insecurity (09/14/2022)   Hunger Vital Sign    Worried About Running Out of Food in the Last Year: Never true    Ran Out of Food in the Last Year: Never true  Transportation Needs: No  Transportation Needs (09/14/2022)   PRAPARE - Administrator, Civil Service (Medical): No    Lack of Transportation (Non-Medical): No  Physical Activity: Not on file  Stress: Not on file  Social Connections: Not on file  Intimate Partner Violence: Not At Risk (09/14/2022)   Humiliation, Afraid, Rape, and Kick questionnaire    Fear of Current or Ex-Partner: No    Emotionally Abused: No    Physically Abused: No    Sexually Abused: No    Review of Systems  Constitutional:  Positive for weight loss. Negative for chills, fever and malaise/fatigue.       Intentional  Eyes:  Negative for blurred vision and double vision.  Respiratory:  Negative for cough and shortness of breath.   Cardiovascular:  Negative for chest pain, palpitations and leg swelling.  Gastrointestinal:  Negative for abdominal pain, constipation, diarrhea, nausea and vomiting.  Genitourinary:  Negative for dysuria, frequency and urgency.  Musculoskeletal:  Positive for back pain. Negative for falls.  Neurological:  Positive for headaches. Negative for dizziness and focal weakness.       With changes in weather pattern  Psychiatric/Behavioral:  Negative for depression. The patient is not nervous/anxious.         Objective    BP 114/82 (BP Location: Left Arm, Patient Position: Sitting)   Pulse (!) 59   Temp 97.8 F (36.6 C) (Temporal)   Ht 5' 6 (1.676 m)   Wt 205 lb (93 kg)   LMP 04/06/2024 (Exact Date)   SpO2 100%   Breastfeeding No   BMI 33.09 kg/m   Physical Exam Constitutional:      General: She is not in acute distress.    Appearance: She is not ill-appearing.  HENT:     Mouth/Throat:     Mouth: Mucous membranes are moist.     Pharynx: Oropharynx is clear.   Eyes:     Extraocular Movements: Extraocular movements intact.     Conjunctiva/sclera: Conjunctivae normal.    Cardiovascular:     Rate and Rhythm: Normal rate.  Pulmonary:     Effort: Pulmonary effort is normal.    Musculoskeletal:     Cervical back: Normal range of motion and neck supple.   Skin:  General: Skin is warm and dry.   Neurological:     General: No focal deficit present.     Mental Status: She is alert and oriented to person, place, and time.     Cranial Nerves: No cranial nerve deficit.     Motor: No weakness.     Coordination: Coordination normal.     Gait: Gait normal.   Psychiatric:        Mood and Affect: Mood normal.        Behavior: Behavior normal.        Thought Content: Thought content normal.     {Labs (Optional):23779}    Assessment & Plan:   Problem List Items Addressed This Visit     Chronic right-sided low back pain   Relevant Orders   Ambulatory referral to Sports Medicine   Elevated cholesterol - Primary   Relevant Orders   Lipid panel   Elevated liver enzymes   Relevant Orders   CBC with Differential/Platelet   Basic metabolic panel with GFR   Hepatic function panel   Gilbert's syndrome   Obesity (BMI 30-39.9)   Relevant Medications   ZEPBOUND 5 MG/0.5ML Pen   Other Relevant Orders   CBC with Differential/Platelet   Hemoglobin A1c   Lipid panel   TSH   T4, free   Basic metabolic panel with GFR   Hepatic function panel   Other Visit Diagnoses       Vitamin D insufficiency       Relevant Orders   VITAMIN D 25 Hydroxy (Vit-D Deficiency, Fractures)     Vitamin B12 deficiency       Relevant Orders   Vitamin B12       Return in about 4 weeks (around 06/30/2024) for chronic health conditions.   Boby Mackintosh, NP-C

## 2024-06-02 NOTE — Patient Instructions (Signed)
 Thank you for trusting us  with your health care.  Please go downstairs for labs before you leave.  I referred you to [facility] sports medicine and they will call you to schedule.  Continue close follow-up with your OB/GYN and with your mammograms  I will be in touch with your lab results.

## 2024-06-05 ENCOUNTER — Ambulatory Visit: Payer: Self-pay | Admitting: Family Medicine

## 2024-06-05 DIAGNOSIS — E538 Deficiency of other specified B group vitamins: Secondary | ICD-10-CM | POA: Insufficient documentation

## 2024-06-05 DIAGNOSIS — E559 Vitamin D deficiency, unspecified: Secondary | ICD-10-CM | POA: Insufficient documentation

## 2024-06-08 ENCOUNTER — Ambulatory Visit: Admitting: Family Medicine

## 2024-06-08 ENCOUNTER — Ambulatory Visit (INDEPENDENT_AMBULATORY_CARE_PROVIDER_SITE_OTHER)

## 2024-06-08 VITALS — BP 128/80 | HR 83 | Ht 66.0 in | Wt 204.0 lb

## 2024-06-08 DIAGNOSIS — M545 Low back pain, unspecified: Secondary | ICD-10-CM | POA: Diagnosis not present

## 2024-06-08 DIAGNOSIS — G8929 Other chronic pain: Secondary | ICD-10-CM

## 2024-06-08 MED ORDER — TIZANIDINE HCL 2 MG PO TABS: 2.0000 mg | ORAL_TABLET | Freq: Every day | ORAL | 0 refills | Status: AC

## 2024-06-08 NOTE — Patient Instructions (Addendum)
 Thank you for coming in today.   Please get an Xray today before you leave   I've referred you to Physical Therapy.  Let us  know if you don't hear from them in one week.   Try using a heating pad  I've sent a prescription for Tizanidine to your pharmacy.  Check back as needed  *Reminder: Dr. Joane will be out of the office starting August 1st, for about 6 weeks

## 2024-06-08 NOTE — Progress Notes (Signed)
   LILLETTE Ileana Collet, PhD, LAT, ATC acting as a scribe for Artist Lloyd, MD.  Emma Reynolds is a 43 y.o. female who presents to [facility] at [facility] today for LBP ongoing since Oct 2023. Pain started from picking up her baby. Pt locates pain to both sides of her low back, more proximally. She did not find prior PT or chiro care helpful.    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   MSK: L-spine: Normal appearing Normal motion. Nontender palpation spinal midline.  Tender palpation paraspinal musculature bilateral lumbar spine. Normal motion pain with flexion. Lower extremity strength and reflexes are intact and equal bilaterally.    Lab and Radiology Results  X-ray images lumbar spine obtained today personally and independently interpreted. Mild degenerative changes lower portion lumbar spine.  No acute fractures or malalignment. Await formal radiology review     Assessment and Plan: 43 y.o. female with chronic low back pain ongoing for around the last 2 years following the birth of her last child. Pain today due to muscle spasm and dysfunction.  She is a great candidate for physical therapy.  Plan to refer to PT.  Continue Tylenol  or ibuprofen  or both as needed.  I have prescribed tizanidine to use as needed mostly at bedtime.  Recommend also a heating pad. If not improving recheck.  Consider MRI on recheck if not improved following completion of physical therapy.  PDMP not reviewed this encounter. Orders Placed This Encounter  Procedures    DG Lumbar Spine 2-3 Views    Standing Status:   Future    Number of Occurrences:   1    Expiration Date:   07/09/2024    Reason for Exam (SYMPTOM  OR DIAGNOSIS REQUIRED):   low back pain    Preferred imaging location?:   [facility]    Is patient pregnant?:   No   Ambulatory referral to Physical Therapy    Referral Priority:   Routine    Referral Type:   Physical Medicine    Referral Reason:   Specialty Services Required    Requested Specialty:   Physical Therapy    Number of Visits Requested:   1   Meds ordered this encounter  Medications   tiZANidine (ZANAFLEX) 2 MG tablet    Sig: Take 1-2 tablets (2-4 mg total) by mouth at bedtime.    Dispense:  30 tablet    Refill:  0     Discussed warning signs or symptoms. Please see discharge instructions. Patient expresses understanding.   The above documentation has been reviewed and is accurate and complete Artist Lloyd, M.D.

## 2024-06-12 ENCOUNTER — Ambulatory Visit: Payer: Self-pay | Admitting: Family Medicine

## 2024-06-12 ENCOUNTER — Other Ambulatory Visit

## 2024-06-12 ENCOUNTER — Encounter

## 2024-06-12 NOTE — Progress Notes (Signed)
 Low back x-ray shows a little bit of arthritis at the base of the spine at L5-S1.

## 2024-06-14 ENCOUNTER — Ambulatory Visit
Admission: RE | Admit: 2024-06-14 | Discharge: 2024-06-14 | Disposition: A | Discharge status: Home / Self Care | Source: Ambulatory Visit | Attending: Obstetrics and Gynecology | Admitting: Obstetrics and Gynecology

## 2024-06-14 ENCOUNTER — Other Ambulatory Visit: Payer: Self-pay | Admitting: Obstetrics and Gynecology

## 2024-06-14 DIAGNOSIS — N6489 Other specified disorders of breast: Secondary | ICD-10-CM

## 2024-06-14 DIAGNOSIS — N6002 Solitary cyst of left breast: Secondary | ICD-10-CM

## 2024-07-06 ENCOUNTER — Ambulatory Visit: Admitting: Family Medicine

## 2024-08-02 ENCOUNTER — Ambulatory Visit: Admitting: Internal Medicine

## 2024-10-27 ENCOUNTER — Other Ambulatory Visit: Payer: Self-pay | Admitting: Medical Genetics

## 2024-12-26 ENCOUNTER — Ambulatory Visit
Admission: RE | Admit: 2024-12-26 | Discharge: 2024-12-26 | Disposition: A | Source: Ambulatory Visit | Attending: Obstetrics and Gynecology | Admitting: Obstetrics and Gynecology

## 2024-12-26 ENCOUNTER — Other Ambulatory Visit: Payer: Self-pay | Admitting: Obstetrics and Gynecology

## 2024-12-26 DIAGNOSIS — N6002 Solitary cyst of left breast: Secondary | ICD-10-CM

## 2024-12-26 DIAGNOSIS — N6489 Other specified disorders of breast: Secondary | ICD-10-CM
